# Patient Record
Sex: Female | Born: 2000
Health system: Southern US, Community
[De-identification: ages and names within clinical notes are randomized; demographics above are authoritative.]

## PROBLEM LIST (undated history)

## (undated) DIAGNOSIS — R569 Unspecified convulsions: Secondary | ICD-10-CM

## (undated) DIAGNOSIS — F32A Depression, unspecified: Secondary | ICD-10-CM

## (undated) HISTORY — PX: APPENDECTOMY: SHX54

## (undated) HISTORY — DX: Unspecified convulsions: R56.9

## (undated) HISTORY — DX: Depression, unspecified: F32.A

---
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
## 2018-01-05 ENCOUNTER — Ambulatory Visit (HOSPITAL_COMMUNITY)
Admission: RE | Admit: 2018-01-05 | Discharge: 2018-01-05 | Disposition: A | Discharge status: Home / Self Care | Payer: Medicaid Other | Source: Ambulatory Visit | Attending: Neurology | Admitting: Neurology

## 2018-01-05 ENCOUNTER — Ambulatory Visit (INDEPENDENT_AMBULATORY_CARE_PROVIDER_SITE_OTHER): Payer: Medicaid Other | Admitting: Neurology

## 2018-01-05 ENCOUNTER — Encounter (INDEPENDENT_AMBULATORY_CARE_PROVIDER_SITE_OTHER): Payer: Self-pay | Admitting: Neurology

## 2018-01-05 VITALS — BP 110/64 | HR 70 | Ht 63.78 in | Wt 155.0 lb

## 2018-01-05 DIAGNOSIS — G40B09 Juvenile myoclonic epilepsy, not intractable, without status epilepticus: Secondary | ICD-10-CM

## 2018-01-05 DIAGNOSIS — Z79899 Other long term (current) drug therapy: Secondary | ICD-10-CM | POA: Diagnosis not present

## 2018-01-05 NOTE — Progress Notes (Signed)
Sleep deprived EEG completed; results pending. 

## 2018-01-05 NOTE — Procedures (Signed)
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  This is a follow-up EEG for evaluation of epileptiform discharges on lower dose of seizure medication.  Medication: Keppra  Procedure: The tracing was carried out on a 32 channel digital Cadwell recorder reformatted into 16 channel montages with 1 devoted to EKG.  The 10 /20 international system electrode placement was used. Recording was done during awake, drowsiness and sleep states. Recording time 42.5 minutes.   Description of findings: Background rhythm consists of amplitude of 50 microvolt and frequency of 9-10 hertz posterior dominant rhythm. There was normal anterior posterior gradient noted. Background was well organized, continuous and symmetric with no focal slowing. There was muscle artifact noted. During drowsiness and sleep there was gradual decrease in background frequency noted. During the early stages of sleep there were symmetrical sleep spindles and vertex sharp waves noted.  Hyperventilation resulted in slowing of the background activity. Photic simulation using stepwise increase in photic frequency resulted in bilateral symmetric driving response.  There was no photoparoxysmal response. Throughout the recording there were no focal or generalized epileptiform activities in the form of spikes or sharps noted. There were no transient rhythmic activities or electrographic seizures noted. One lead EKG rhythm strip revealed sinus rhythm at a rate of 70 bpm.  Impression: This EEG is normal during awake and sleep states. Please note that normal EEG does not exclude epilepsy, clinical correlation is indicated.     Keturah Shaverseza Jackalynn Art, MD

## 2018-01-05 NOTE — Patient Instructions (Signed)
Start taking 1000 mg Keppra every night for 1 month Then take 500 mg Keppra every night for 1 month Then discontinue medication

## 2018-01-05 NOTE — Progress Notes (Signed)
Patient: Christina Morales MRN: [id number] Sex: female DOB: 28-May-2001  Provider: Keturah Shavers, MD Location of Care: [facility] Child Neurology  Note type: Routine return visit  Referral Source: Christina Loveless, MD History from: patient and [facility] chart Chief Complaint: EEG Results  History of Present Illness: Christina Morales is a 17 y.o. female is here for follow-up management of seizure disorder and EEG result.  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 Currently she is on 1250 mg Keppra every night for the past few weeks and she is going to start 1000 mg Keppra soon. She underwent an EEG with sleep deprivation today which did not show any epileptiform discharges or seizure activity and no photoparoxysmal response during photic stimulation. As per patient she has had no clinical seizure activity or jerking episodes, sleeping well without any difficulty and has had no other issues.  Review of Systems: 12 system review as per HPI, otherwise negative.  Past Medical History:  Diagnosis Date  . Seizures [facility])     Surgical History History reviewed. No pertinent surgical history.  Family History family history is not on file.   Social History Social History   Socioeconomic History  . Marital status: Single    Spouse name: Not on file  . Number of children: Not on file  . Years of education: Not on file  . Highest education level: Not on file  Occupational History  . Not on file  Social Needs  . Financial resource strain: Not on file  . Food insecurity:    Worry: Not on file    Inability: Not on file  . Transportation needs:     Medical: Not on file    Non-medical: Not on file  Tobacco Use  . Smoking status: Never Smoker  . Smokeless tobacco: Never Used  Substance and Sexual Activity  . Alcohol use: No  . Drug use: No  . Sexual activity: Never    Birth control/protection: Abstinence  Lifestyle  . Physical activity:    Days per week: Not on file    Minutes per session: Not on file  . Stress: Not on file  Relationships  . Social connections:    Talks on phone: Not on file    Gets together: Not on file    Attends religious service: Not on file    Active member of club or organization: Not on file    Attends meetings of clubs or organizations: Not on file    Relationship status: Not on file  Other Topics Concern  . 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    She enjoys theatre and video games.   She is only working at the moment.     The medication list was reviewed and reconciled. All changes or newly prescribed medications were explained.  A complete medication list was provided to the patient/caregiver.  No Known Allergies  Physical Exam BP 110/64   Pulse 70   Ht 5' 3.78" (1.62 m)   Wt 154 lb 15.7 oz (70.3  kg)   BMI 26.79 kg/m  Gen: Awake, alert, not in distress Skin: No rash, No neurocutaneous stigmata. HEENT: Normocephalic, no dysmorphic features, no conjunctival injection, nares patent, mucous membranes moist, oropharynx clear. Neck: Supple, no meningismus. No focal tenderness. Resp: Clear to auscultation bilaterally CV: Regular rate, normal S1/S2, no murmurs, no rubs Abd: BS present, abdomen soft, non-tender, non-distended. No hepatosplenomegaly or mass Ext: Warm and well-perfused. No deformities, no muscle wasting, ROM full.  Neurological Examination: MS: Awake, alert, interactive. Normal eye contact, answered the questions appropriately, speech was fluent,  Normal comprehension.  Attention and concentration  were normal. Cranial Nerves: Pupils were equal and reactive to light ( 5-793mm);  normal fundoscopic exam with sharp discs, visual field full with confrontation test; EOM normal, no nystagmus; no ptsosis, no double vision, intact facial sensation, face symmetric with full strength of facial muscles, hearing intact to finger rub bilaterally, palate elevation is symmetric, tongue protrusion is symmetric with full movement to both sides.  Sternocleidomastoid and trapezius are with normal strength. Tone-Normal Strength-Normal strength in all muscle groups DTRs-  Biceps Triceps Brachioradialis Patellar Ankle  R 2+ 2+ 2+ 2+ 2+  L 2+ 2+ 2+ 2+ 2+   Plantar responses flexor bilaterally, no clonus noted Sensation: Intact to light touch,  Romberg negative. Coordination: No dysmetria on FTN test. No difficulty with balance. Gait: Normal walk and run. Tandem gait was normal. Was able to perform toe walking and heel walking without difficulty.  Assessment and Plan 1. 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 Her EEG in 2018 did not show any abnormal discharges and her repeat EEG on lower dose of Keppra today did not show any abnormality. Since she has been doing well without any clinical seizure activity and with normal exam and normal EEGs over the past 2 years, recommend to continue with tapering of the Keppra and discontinue medication over the next 2 months. As discussed before, there would be 30% chance of having seizure activity with discontinuation of the medication but patient would like to try and see how she does. If there is any episodes of jerking or shaking, she will call my office to schedule for another EEG at any time otherwise she will continue  tapering and discontinue the medication as a scheduled and then continue follow-up with her PCP and no neurology appointment needed.  She understood and agreed with the plan.

## 2018-01-08 ENCOUNTER — Ambulatory Visit (INDEPENDENT_AMBULATORY_CARE_PROVIDER_SITE_OTHER): Payer: Medicaid Other | Admitting: Neurology

## 2018-04-06 ENCOUNTER — Encounter (HOSPITAL_BASED_OUTPATIENT_CLINIC_OR_DEPARTMENT_OTHER): Payer: Self-pay

## 2018-04-06 ENCOUNTER — Emergency Department (HOSPITAL_BASED_OUTPATIENT_CLINIC_OR_DEPARTMENT_OTHER)
Admission: EM | Admit: 2018-04-06 | Discharge: 2018-04-06 | Disposition: A | Discharge status: Home / Self Care | Payer: Medicaid Other | Attending: Emergency Medicine | Admitting: Emergency Medicine

## 2018-04-06 ENCOUNTER — Other Ambulatory Visit: Payer: Self-pay

## 2018-04-06 DIAGNOSIS — Z79899 Other long term (current) drug therapy: Secondary | ICD-10-CM | POA: Diagnosis not present

## 2018-04-06 DIAGNOSIS — K5900 Constipation, unspecified: Secondary | ICD-10-CM | POA: Insufficient documentation

## 2018-04-06 DIAGNOSIS — R519 Headache, unspecified: Secondary | ICD-10-CM

## 2018-04-06 DIAGNOSIS — R51 Headache: Secondary | ICD-10-CM | POA: Diagnosis not present

## 2018-04-06 DIAGNOSIS — R112 Nausea with vomiting, unspecified: Secondary | ICD-10-CM | POA: Diagnosis present

## 2018-04-06 LAB — COMPREHENSIVE METABOLIC PANEL
ALBUMIN: 4.3 g/dL (ref 3.5–5.0)
ALT: 13 U/L (ref 0–44)
ANION GAP: 11 (ref 5–15)
AST: 18 U/L (ref 15–41)
Alkaline Phosphatase: 92 U/L (ref 38–126)
BILIRUBIN TOTAL: 0.6 mg/dL (ref 0.3–1.2)
BUN: 8 mg/dL (ref 6–20)
CO2: 26 mmol/L (ref 22–32)
Calcium: 9.6 mg/dL (ref 8.9–10.3)
Chloride: 101 mmol/L (ref 98–111)
Creatinine, Ser: 0.65 mg/dL (ref 0.44–1.00)
GFR calc Af Amer: >60 mL/min (ref >60)
GFR calc non Af Amer: >60 mL/min (ref >60)
GLUCOSE: 99 mg/dL (ref 70–99)
POTASSIUM: 4.4 mmol/L (ref 3.5–5.1)
Sodium: 138 mmol/L (ref 135–145)
TOTAL PROTEIN: 8 g/dL (ref 6.5–8.1)

## 2018-04-06 LAB — URINALYSIS, ROUTINE W REFLEX MICROSCOPIC
Bilirubin Urine: NEGATIVE
Glucose, UA: NEGATIVE mg/dL
Hgb urine dipstick: NEGATIVE
Ketones, ur: NEGATIVE mg/dL
LEUKOCYTES UA: NEGATIVE
NITRITE: NEGATIVE
PROTEIN: NEGATIVE mg/dL
Specific Gravity, Urine: 1.015 (ref 1.005–1.030)
pH: 8.5 — ABNORMAL HIGH (ref 5.0–8.0)

## 2018-04-06 LAB — CBC WITH DIFFERENTIAL/PLATELET
BASOS ABS: 0 10*3/uL (ref 0.0–0.1)
BASOS PCT: 0 %
Eosinophils Absolute: 0 10*3/uL (ref 0.0–0.7)
Eosinophils Relative: 0 %
HCT: 42.3 % (ref 36.0–46.0)
HEMOGLOBIN: 14.5 g/dL (ref 12.0–15.0)
Lymphocytes Relative: 10 %
Lymphs Abs: 0.9 10*3/uL (ref 0.7–4.0)
MCH: 30.2 pg (ref 26.0–34.0)
MCHC: 34.3 g/dL (ref 30.0–36.0)
MCV: 88.1 fL (ref 78.0–100.0)
MONO ABS: 0.3 10*3/uL (ref 0.1–1.0)
Monocytes Relative: 3 %
NEUTROS ABS: 8.1 10*3/uL — AB (ref 1.7–7.7)
NEUTROS PCT: 87 %
Platelets: 298 10*3/uL (ref 150–400)
RBC: 4.8 MIL/uL (ref 3.87–5.11)
RDW: 12.6 % (ref 11.5–15.5)
WBC: 9.4 10*3/uL (ref 4.0–10.5)

## 2018-04-06 LAB — PREGNANCY, URINE: PREG TEST UR: NEGATIVE

## 2018-04-06 LAB — LIPASE, BLOOD: LIPASE: 22 U/L (ref 11–51)

## 2018-04-06 MED ORDER — PROCHLORPERAZINE EDISYLATE 10 MG/2ML IJ SOLN
5.0000 mg | Freq: Once | INTRAMUSCULAR | Status: AC
  Administered 2018-04-06: 5 mg via INTRAVENOUS
  Filled 2018-04-06: qty 2

## 2018-04-06 MED ORDER — POLYETHYLENE GLYCOL 3350 17 G PO PACK: 17.0000 g | PACK | Freq: Every day | ORAL | 0 refills | Status: DC

## 2018-04-06 MED ORDER — SODIUM CHLORIDE 0.9 % IV BOLUS
1000.0000 mL | Freq: Once | INTRAVENOUS | Status: AC
  Administered 2018-04-06: 1000 mL via INTRAVENOUS

## 2018-04-06 MED ORDER — DIPHENHYDRAMINE HCL 50 MG/ML IJ SOLN
12.5000 mg | Freq: Once | INTRAMUSCULAR | Status: AC
  Administered 2018-04-06: 12.5 mg via INTRAVENOUS
  Filled 2018-04-06: qty 1

## 2018-04-06 MED ORDER — KETOROLAC TROMETHAMINE 15 MG/ML IJ SOLN
15.0000 mg | Freq: Once | INTRAMUSCULAR | Status: AC
  Administered 2018-04-06: 15 mg via INTRAVENOUS
  Filled 2018-04-06: qty 1

## 2018-04-06 MED FILL — SM CLEARLAX POWDER: 14 days supply | Qty: 238 | Fill #0

## 2018-04-06 NOTE — Discharge Instructions (Signed)
Please follow-up with your neurologist in regards to your frequent headaches this month.  Please take the MiraLAX as directed on your discharge paperwork and follow-up with your primary care doctor in regards to your constipation although this month.  Please return to the emergency department for any new or worsening symptoms including severe headaches, vision changes, numbness/weakness to the arms or legs, persistent nausea vomiting, fevers or chills.

## 2018-04-06 NOTE — ED Provider Notes (Signed)
[facility] EMERGENCY DEPARTMENT Provider Note   CSN: [id number] Arrival date & time: 04/06/18  1346     History   Chief Complaint Chief Complaint  Patient presents with  . Emesis    HPI Christina Morales is a 17 y.o. female.  HPI   Pt is a 17 y/o female with a h/o seizures who presents to the ED today for evaluation of nausea and vomiting that began yesterday. Has had 5 episodes of vomiting. States that she also has a left sided headache with her sxs.  Denies abd pain, diarrhea, bloody stools, dysuria, hematuria. Denies vision changes, lightheadedness, dizziness, numbness, weakness.  States that she has a h/o chronic constipation over the last month and had been constipated prior to the onset of sxs. States that her last BM was 4 days ago. Not taking meds for this. States she has been able to keep her seizure medications down.   States she has had similar sxs intermittently for the last month with constipation and a headache leading to NV. Denies a h/o migraines. Reviewed records and pt was seen for similar sxs in 03/2013. She currently follows with peds neuro for her h/o seizures. Had normal CT brain 09/2013.  Past Medical History:  Diagnosis Date  . Seizures [facility])     Patient Active Problem List   Diagnosis Date Noted  . 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 Juvenile myoclonic epilepsy (HCC) 10/06/2013    History reviewed. No pertinent surgical history.   OB History   None      Home Medications    Prior to Admission medications   Medication Sig Start Date End Date Taking? Authorizing Provider  acetaminophen (TYLENOL) 325 MG tablet Take 650 mg by mouth every 6 (six) hours as needed. Reported on 08/08/2015    [provider]  ibuprofen (ADVIL,MOTRIN) 200 MG tablet Take 400 mg by mouth every 6 (six) hours as needed. Reported on 08/08/2015    [provider]  levETIRAcetam (KEPPRA XR) 500 MG 24  hr tablet Take 2 tablets (1,000 mg total) by mouth daily. At night 10/08/17   Keturah Shavers, MD  naproxen sodium (ANAPROX) 220 MG tablet Take 220 mg by mouth. Reported on 08/08/2015    [provider]  ondansetron (ZOFRAN ODT) 8 MG disintegrating tablet 8mg  ODT q8 hours prn nausea Patient not taking: Reported on 10/08/2017 02/10/16   Palumbo, April, MD  polyethylene glycol [facility]) packet Take 17 g by mouth daily. Dissolve one cap full in solution (water, gatorade, etc.) and administer once cap-full daily. You may titrate up daily by 1 cap-full until the patient is having pudding consistency of stools. After the patient is able to start passing softer stools they will need to be on 1/2 cap-full daily for 2 weeks. 04/06/18   Kinneth Fujiwara S, PA-C    Family History No family history on file.  Social History Social History   Tobacco Use  . Smoking status: Never Smoker  . Smokeless tobacco: Never Used  Substance Use Topics  . Alcohol use: No  . Drug use: No     Allergies   Patient has no known allergies.   Review of Systems Review of Systems  Constitutional: Negative for chills and fever.  HENT: Positive for congestion (chronic).   Eyes: Negative for photophobia and visual disturbance.  Respiratory: Negative for shortness of breath.   Cardiovascular: Negative for chest pain.  Gastrointestinal: Positive for constipation, nausea and vomiting.  Negative for abdominal pain, blood in stool and diarrhea.  Genitourinary: Negative for dysuria, flank pain, hematuria and urgency.  Musculoskeletal: Negative for back pain, neck pain and neck stiffness.  Skin: Negative for wound.  Neurological: Positive for headaches. Negative for dizziness, weakness, light-headedness and numbness.   Physical Exam Updated Vital Signs BP 116/87 (BP Location: Right Arm)   Pulse 74   Temp 98 F (36.7 C) (Oral)   Resp 18   Ht 5\' 3"  (1.6 m)   Wt 68.5 kg   LMP  (LMP Unknown)   SpO2 100%   BMI 26.75  kg/m   Physical Exam  Constitutional: She appears well-developed and well-nourished. No distress.  HENT:  Head: Normocephalic and atraumatic.  Right Ear: External ear normal.  Left Ear: External ear normal.  Mouth/Throat: Oropharynx is clear and moist.  bilat TMs normal.   Eyes: Pupils are equal, round, and reactive to light. Conjunctivae and EOM are normal.  No horizontal or vertical nystagmus  Neck: Normal range of motion. Neck supple.  Cardiovascular: Normal rate, regular rhythm and normal heart sounds.  No murmur heard. Pulmonary/Chest: Effort normal and breath sounds normal. No stridor. No respiratory distress. She has no wheezes.  Abdominal: Soft. Bowel sounds are normal. She exhibits no distension. There is no tenderness. There is no guarding.  Musculoskeletal: Normal range of motion.  Neurological: She is alert.  Mental Status:  Alert, thought content appropriate, able to give a coherent history. Speech fluent without evidence of aphasia. Able to follow 2 step commands without difficulty.  Cranial Nerves:  II: pupils equal, round, reactive to light III,IV, VI: ptosis not present, extra-ocular motions intact bilaterally  V,VII: smile symmetric, facial light touch sensation equal VIII: hearing grossly normal to voice  X: uvula elevates symmetrically  XI: bilateral shoulder shrug symmetric and strong XII: midline tongue extension without fassiculations Motor:  Normal tone. 5/5 strength of BUE and BLE major muscle groups including strong and equal grip strength and dorsiflexion/plantar flexion Sensory: light touch normal in all extremities. Cerebellar: normal finger-to-nose with bilateral upper extremities, normal heel to shin Gait: normal gait and balance. Able to walk on toes and heels with ease.  CV: 2+ radial and DP pulses Negative romberg  Skin: Skin is warm and dry.  Psychiatric: She has a normal mood and affect.  Nursing note and vitals reviewed.   ED Treatments /  Results  Labs (all labs ordered are listed, but only abnormal results are displayed) Labs Reviewed  URINALYSIS, ROUTINE W REFLEX MICROSCOPIC - Abnormal; Notable for the following components:      Result Value   APPearance CLOUDY (*)    pH 8.5 (*)    All other components within normal limits  CBC WITH DIFFERENTIAL/PLATELET - Abnormal; Notable for the following components:   Neutro Abs 8.1 (*)    All other components within normal limits  PREGNANCY, URINE  COMPREHENSIVE METABOLIC PANEL  LIPASE, BLOOD    EKG None  Radiology No results found.  Procedures Procedures (including critical care time)  Medications Ordered in ED Medications  sodium chloride 0.9 % bolus 1,000 mL (0 mLs Intravenous Stopped 04/06/18 1600)  prochlorperazine (COMPAZINE) injection 5 mg (5 mg Intravenous Given 04/06/18 1454)  ketorolac (TORADOL) 15 MG/ML injection 15 mg (15 mg Intravenous Given 04/06/18 1454)  diphenhydrAMINE (BENADRYL) injection 12.5 mg (12.5 mg Intravenous Given 04/06/18 1454)     Initial Impression / Assessment and Plan / ED Course  I have reviewed the triage vital signs and the  nursing notes.  Pertinent labs & imaging results that were available during my care of the patient were reviewed by me and considered in my medical decision making (see chart for details).    Final Clinical Impressions(s) / ED Diagnoses   Final diagnoses:  Acute nonintractable headache, unspecified headache type  Constipation, unspecified constipation type   Pt presenting for NV, constipation, and left sided headache x2 days. VSS, afebrile. Normal neuro and abdominal exam. Has a h/o similar sxs in the past but has not been diagnosed with migraines. She currently follows with peds neurology. At this time unclear if NV is a result of constipation or if related to headache though seems more likely related to HA. Will check labs and give migraine cocktail and reassess.  Labs reassuring. CBC with no leukocytosis or  anemia. CMP with normal electrolytes, kidney and liver function. Lipase WNL. Pregnancy test negative. UA without evidence of UTI.  After medications pt feels improved and states that she no longer has a headache. Nausea has also improved and she is now tolerating PO. Her initial abd exam was benign and down emergency intraabdominal pathology at this time.  Will give rx for miralax for her constipation and have her f/u with her neurologist in relation to her headaches this month. She is agreeable to the plan for f/u. Gave strict return precautions for any new or worsening sxs. All questions answered. Pt stable for d/c.  ED Discharge Orders         Ordered    polyethylene glycol [facility]) packet  Daily     04/06/18 [address], Yani Coventry S, PA-C 04/06/18 1612    Pricilla Loveless, MD 04/07/18 [id number]

## 2018-04-06 NOTE — ED Triage Notes (Signed)
C/o n/v x 2 weeks- HA x 2 days-NAD-steady gait

## 2018-04-09 ENCOUNTER — Telehealth: Payer: Self-pay | Admitting: Neurology

## 2018-04-09 DIAGNOSIS — G40B09 Juvenile myoclonic epilepsy, not intractable, without status epilepticus: Secondary | ICD-10-CM

## 2018-04-09 NOTE — Telephone Encounter (Signed)
°  Who's calling (name and relationship to patient) :Joyce Copa I (Self)  Best contact number: [phone] (M)  Provider they see: Devonne Doughty  Reason for call: patient states that she has been having severe headaches, wants to know if a EEG should be conducted

## 2018-04-09 NOTE — Telephone Encounter (Signed)
Called patient there was no answer and mailbox was full so I was not able to leave message. Tresa Endo, Please schedule her for a routine EEG over the next few days and tell her to take occasional Tylenol or ibuprofen for now. Send the EEG to me so I would read that and let you know about the result and also make an appointment for a couple of weeks and tell her to make a diary of the headaches so I will see if we need to start any medication for headache if the EEG is normal.  If there is any severe headache or vomiting then she needs to go to the emergency room.  I placed an order for routine EEG.

## 2018-04-09 NOTE — Telephone Encounter (Signed)
Pt states that she has been having headaches for about a month, had nausea and vomiting. Took ibuprofen and aleve with no relief. No changes to vision. Sensitive to light while having the headache. I let patient know that I would ask Dr. Devonne Doughty what he advises and give her a call back.

## 2018-04-09 NOTE — Telephone Encounter (Signed)
Attempted to call patient to get more info. Vm was full. Will send to Dr. Devonne Doughty to see what he advises

## 2018-04-12 NOTE — Telephone Encounter (Signed)
Spoke to patient and let her know what Dr. Devonne Doughty advised. Made an appt for EEG as well as a f/u. Patient is aware to keep a diary and to go to the ER if severe headache occurs.

## 2018-04-13 ENCOUNTER — Other Ambulatory Visit: Payer: Self-pay

## 2018-04-13 ENCOUNTER — Encounter (HOSPITAL_COMMUNITY): Payer: Self-pay

## 2018-04-13 ENCOUNTER — Emergency Department (HOSPITAL_COMMUNITY)
Admission: EM | Admit: 2018-04-13 | Discharge: 2018-04-14 | Disposition: A | Discharge status: Home / Self Care | Payer: Medicaid Other | Source: Home / Self Care | Attending: Emergency Medicine | Admitting: Emergency Medicine

## 2018-04-13 ENCOUNTER — Emergency Department (HOSPITAL_COMMUNITY): Payer: Medicaid Other

## 2018-04-13 ENCOUNTER — Encounter (HOSPITAL_COMMUNITY): Payer: Self-pay | Admitting: Emergency Medicine

## 2018-04-13 ENCOUNTER — Emergency Department (HOSPITAL_COMMUNITY)
Admission: EM | Admit: 2018-04-13 | Discharge: 2018-04-13 | Disposition: A | Discharge status: Home / Self Care | Payer: Medicaid Other | Attending: Emergency Medicine | Admitting: Emergency Medicine

## 2018-04-13 DIAGNOSIS — Z79899 Other long term (current) drug therapy: Secondary | ICD-10-CM

## 2018-04-13 DIAGNOSIS — R55 Syncope and collapse: Secondary | ICD-10-CM

## 2018-04-13 DIAGNOSIS — R569 Unspecified convulsions: Secondary | ICD-10-CM | POA: Diagnosis not present

## 2018-04-13 MED ORDER — LEVETIRACETAM ER 500 MG PO TB24: 1000.0000 mg | ORAL_TABLET | Freq: Every day | ORAL | 0 refills | Status: DC

## 2018-04-13 MED ORDER — ONDANSETRON 4 MG PO TBDP: ORAL_TABLET | ORAL | 0 refills | Status: DC

## 2018-04-13 MED ORDER — SODIUM CHLORIDE 0.9 % IV BOLUS
1000.0000 mL | Freq: Once | INTRAVENOUS | Status: AC
  Administered 2018-04-14: 1000 mL via INTRAVENOUS

## 2018-04-13 NOTE — ED Provider Notes (Signed)
[facility]-EMERGENCY DEPT Provider Note: Lowella Dell, MD, FACEP  CSN: [id number] MRN: [id number] ARRIVAL: 04/13/18 at 1934 ROOM: WA17/WA17   CHIEF COMPLAINT  Near Syncope   HISTORY OF PRESENT ILLNESS  04/13/18 11:19 PM Christina Morales is a 17 y.o. 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  She was seen this morning for a seizure while taking a shower.  CT of the head and laboratory studies were unremarkable.  She returns because of a near syncopal episode about 7:30 PM.  She was getting up to go to the bathroom and felt lightheaded.  Her mother helped her to the ground but she did not fully lose consciousness.  Her friend found her hyperventilating and complaining of numbness in her hands.  She remembers feeling lightheaded and "seeing stars" but does not remember the rest of the episode.  She did not have seizure-like activity at the time.  She is complaining of intermittent nausea for the last month for which she was given Zofran earlier today.  She is also complaining of a headache and feeling thirsty.   Past Medical History:  Diagnosis Date  . Seizures (HCC)     History reviewed. No pertinent surgical history.  History reviewed. No pertinent family history.  Social History   Tobacco Use  . Smoking status: Never Smoker  . Smokeless tobacco: Never Used  Substance Use Topics  . Alcohol use: No  . Drug use: No    Prior to Admission medications   Medication Sig Start Date End Date Taking? Authorizing Provider  ibuprofen (ADVIL,MOTRIN) 200 MG tablet Take 400 mg by mouth daily as needed for headache.     [provider]  levETIRAcetam (KEPPRA XR) 500 MG 24 hr tablet Take 2 tablets (1,000 mg total) by mouth daily. At night 04/13/18 05/13/18  Mortis, Jerrel Ivory I, PA-C  ondansetron (ZOFRAN-ODT) 4 MG disintegrating tablet 8mg  ODT q8 hours prn nausea 04/13/18   Mortis, Jerrel Ivory I, PA-C  polyethylene glycol (MIRALAX) packet Take 17 g by mouth  daily. Dissolve one cap full in solution (water, gatorade, etc.) and administer once cap-full daily. You may titrate up daily by 1 cap-full until the patient is having pudding consistency of stools. After the patient is able to start passing softer stools they will need to be on 1/2 cap-full daily for 2 weeks. Patient taking differently: Take 17 g by mouth daily as needed for moderate constipation. Dissolve one cap full in solution (water, gatorade, etc.) and administer once cap-full daily. You may titrate up daily by 1 cap-full until the patient is having pudding consistency of stools. After the patient is able to start passing softer stools they will need to be on 1/2 cap-full daily for 2 weeks. 04/06/18   Couture, Cortni S, PA-C    Allergies Patient has no known allergies.   REVIEW OF SYSTEMS  Negative except as noted here or in the History of Present Illness.   PHYSICAL EXAMINATION  Initial Vital Signs Blood pressure 128/78, pulse 81, temperature 98.4 F (36.9 C), temperature source Oral, resp. rate 16, height 5' 3.5" (1.613 m), weight 68.5 kg, SpO2 99 %.  Examination General: Well-developed, well-nourished female in no acute distress; appearance consistent with age of record HENT: normocephalic; atraumatic Eyes: pupils equal, round and reactive to light; extraocular muscles intact Neck: supple Heart: regular rate and rhythm Lungs: clear to auscultation bilaterally Abdomen: soft; nondistended; nontender; bowel sounds present Extremities: No deformity; full range of motion; pulses normal Neurologic: Awake,  alert and oriented; motor function intact in all extremities and symmetric; no facial droop Skin: Warm and dry Psychiatric: Flat affect   RESULTS  Summary of this visit's results, reviewed by myself:   EKG Interpretation  Date/Time:    Ventricular Rate:    PR Interval:    QRS Duration:   QT Interval:    QTC Calculation:   R Axis:     Text Interpretation:         Laboratory Studies: Results for orders placed or performed during the hospital encounter of 04/13/18 (from the past 24 hour(s))  Urinalysis, Routine w reflex microscopic     Status: Abnormal   Collection Time: 04/13/18 11:48 PM  Result Value Ref Range   Color, Urine YELLOW YELLOW   APPearance HAZY (A) CLEAR   Specific Gravity, Urine 1.015 1.005 - 1.030   pH 7.0 5.0 - 8.0   Glucose, UA NEGATIVE NEGATIVE mg/dL   Hgb urine dipstick NEGATIVE NEGATIVE   Bilirubin Urine NEGATIVE NEGATIVE   Ketones, ur 20 (A) NEGATIVE mg/dL   Protein, ur 30 (A) NEGATIVE mg/dL   Nitrite NEGATIVE NEGATIVE   Leukocytes, UA NEGATIVE NEGATIVE   RBC / HPF 0-5 0 - 5 RBC/hpf   WBC, UA 6-10 0 - 5 WBC/hpf   Bacteria, UA RARE (A) NONE SEEN   Squamous Epithelial / LPF 0-5 0 - 5   Mucus PRESENT    Hyaline Casts, UA PRESENT   CBG monitoring, ED     Status: None   Collection Time: 04/14/18 12:18 AM  Result Value Ref Range   Glucose-Capillary 93 70 - 99 mg/dL   Imaging Studies: Ct Head Wo Contrast  Result Date: 04/13/2018 CLINICAL DATA:  Hit head on solid object.  History of seizures EXAM: CT HEAD WITHOUT CONTRAST TECHNIQUE: Contiguous axial images were obtained from the base of the skull through the vertex without intravenous contrast. COMPARISON:  September 12, 2013 FINDINGS: Brain: Ventricles are normal in size and configuration. There is no intracranial mass, hemorrhage, extra-axial fluid collection, or midline shift. Brain parenchyma appears normal. No evident acute infarct. Vascular: No hyperdense vessel. No appreciable vascular calcification evident. Skull: The bony calvarium appears intact. Sinuses/Orbits: There is mucosal thickening throughout portions of the right sphenoid sinus. There is mild mucosal thickening in several ethmoid air cells. Other visualized paranasal sinuses are clear. Visualized orbits appear symmetric bilaterally. Other: Mastoid air cells are clear. IMPRESSION: Paranasal sinus disease, most  notably in the right sphenoid region. Study otherwise unremarkable. Electronically Signed   By: Bretta Bang III M.D.   On: 04/13/2018 11:57    ED COURSE and MDM  Nursing notes and initial vitals signs, including pulse oximetry, reviewed.  Vitals:   04/13/18 2330 04/13/18 2345 04/14/18 0000 04/14/18 0020  BP: 128/81 121/68 132/78 132/78  Pulse: 90 92 85 76  Resp:    17  Temp:      TempSrc:      SpO2: 98% 100% 99% 100%  Weight:      Height:       12:57 AM Patient feeling better after IV fluid bolus and Zofran.  The patient's event does not sound consistent with a seizure.  She was advised to follow-up with her neurologist.  PROCEDURES    ED DIAGNOSES     ICD-10-CM   1. Near syncope R55        Shealyn Sean, Jonny Ruiz, MD 04/14/18 [phone]

## 2018-04-13 NOTE — ED Notes (Signed)
Bed: ZO10 Expected date:  Expected time:  Means of arrival:  Comments: Ems sz

## 2018-04-13 NOTE — ED Provider Notes (Signed)
[facility]-EMERGENCY DEPT Provider Note  CSN: [id number] Arrival date & time: 04/13/18  1047    History   Chief Complaint Chief Complaint  Patient presents with  . Seizures    HPI Christina Morales is a 17 y.o. female with a medical history of seizure disorder who presented to the ED following a possible seizure. Patient's significant other heard the patient collapse while she was in the shower and found her lying in the shower. He did not report any tonic or myoclonic movements. Patient remembers waking up around 9:15am getting in the shower and then she states it was about 9:45am when she began remembering/came out of postictal state.  The history is provided by the patient and a significant other.  Seizures   The current episode started 1 to 2 hours ago. Number of times: Unknown. The most recent episode lasted less than 30 seconds. Associated symptoms include confusion and headaches. Pertinent negatives include no speech difficulty, no visual disturbance, no neck stiffness and no muscle weakness. Unwitnessed The episode was not witnessed. There was no sensation of an aura present. The seizures did not continue in the ED. The seizure(s) had no focality. Possible causes include sleep deprivation. Has been without seizure and antiepileptic for 5 years There has been no fever. There were no medications administered prior to arrival.   Past Medical History:  Diagnosis Date  . Seizures [facility])     Patient Active Problem List   Diagnosis Date Noted  . 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 Juvenile myoclonic epilepsy (HCC) 10/06/2013    History reviewed. No pertinent surgical history.   OB History   None      Home Medications    Prior to Admission medications   Medication Sig Start Date End Date Taking? Authorizing Provider  ibuprofen (ADVIL,MOTRIN) 200 MG tablet Take 400 mg by mouth daily as needed for  headache.    Yes [provider]  polyethylene glycol (MIRALAX) packet Take 17 g by mouth daily. Dissolve one cap full in solution (water, gatorade, etc.) and administer once cap-full daily. You may titrate up daily by 1 cap-full until the patient is having pudding consistency of stools. After the patient is able to start passing softer stools they will need to be on 1/2 cap-full daily for 2 weeks. Patient taking differently: Take 17 g by mouth daily as needed for moderate constipation. Dissolve one cap full in solution (water, gatorade, etc.) and administer once cap-full daily. You may titrate up daily by 1 cap-full until the patient is having pudding consistency of stools. After the patient is able to start passing softer stools they will need to be on 1/2 cap-full daily for 2 weeks. 04/06/18  Yes Couture, Cortni S, PA-C  levETIRAcetam (KEPPRA XR) 500 MG 24 hr tablet Take 2 tablets (1,000 mg total) by mouth daily. At night 04/13/18 05/13/18  Mortis, Jerrel Ivory I, PA-C  ondansetron (ZOFRAN-ODT) 4 MG disintegrating tablet 8mg  ODT q8 hours prn nausea 04/13/18   Mortis, Sharyon Medicus, PA-C    Family History History reviewed. No pertinent family history.  Social History Social History   Tobacco Use  . Smoking status: Never Smoker  . Smokeless tobacco: Never Used  Substance Use Topics  . Alcohol use: No  . Drug use: No     Allergies   Patient has no known allergies.   Review of Systems Review of Systems  Constitutional: Negative.   Eyes: Negative for visual  disturbance.  Musculoskeletal: Negative.   Skin: Negative.   Neurological: Positive for seizures and headaches. Negative for dizziness, speech difficulty, weakness, light-headedness and numbness.  Psychiatric/Behavioral: Positive for confusion.  All other systems reviewed and are negative.  Physical Exam Updated Vital Signs BP 126/86   Pulse 91   Temp 97.9 F (36.6 C) (Oral)   Resp 19   LMP  (LMP Unknown)   SpO2 100%    Physical Exam  Constitutional: She is oriented to person, place, and time. She appears well-developed and well-nourished. No distress.  HENT:  Head: Normocephalic and atraumatic.  Mouth/Throat: Uvula is midline, oropharynx is clear and moist and mucous membranes are normal.  Eyes: Pupils are equal, round, and reactive to light. Conjunctivae, EOM and lids are normal.  Neck: Normal range of motion and full passive range of motion without pain. Neck supple. No spinous process tenderness and no muscular tenderness present. Normal range of motion present.  Cardiovascular: Normal rate, regular rhythm, normal heart sounds, intact distal pulses and normal pulses.  No murmur heard. Pulmonary/Chest: Effort normal and breath sounds normal.  Musculoskeletal: Normal range of motion.  Neurological: She is alert and oriented to person, place, and time. She has normal strength and normal reflexes. No cranial nerve deficit or sensory deficit. She exhibits normal muscle tone. She displays no seizure activity. GCS eye subscore is 4. GCS verbal subscore is 5. GCS motor subscore is 6.  Skin: Skin is warm and intact. Capillary refill takes less than 2 seconds. No abrasion, no bruising and no laceration noted.  Psychiatric: She has a normal mood and affect. Her speech is normal and behavior is normal. Thought content normal. Cognition and memory are normal.  Nursing note and vitals reviewed.  ED Treatments / Results  Labs (all labs ordered are listed, but only abnormal results are displayed) Labs Reviewed - No data to display  EKG None  Radiology Ct Head Wo Contrast  Result Date: 04/13/2018 CLINICAL DATA:  Hit head on solid object.  History of seizures EXAM: CT HEAD WITHOUT CONTRAST TECHNIQUE: Contiguous axial images were obtained from the base of the skull through the vertex without intravenous contrast. COMPARISON:  September 12, 2013 FINDINGS: Brain: Ventricles are normal in size and configuration. There is no  intracranial mass, hemorrhage, extra-axial fluid collection, or midline shift. Brain parenchyma appears normal. No evident acute infarct. Vascular: No hyperdense vessel. No appreciable vascular calcification evident. Skull: The bony calvarium appears intact. Sinuses/Orbits: There is mucosal thickening throughout portions of the right sphenoid sinus. There is mild mucosal thickening in several ethmoid air cells. Other visualized paranasal sinuses are clear. Visualized orbits appear symmetric bilaterally. Other: Mastoid air cells are clear. IMPRESSION: Paranasal sinus disease, most notably in the right sphenoid region. Study otherwise unremarkable. Electronically Signed   By: Bretta Bang III M.D.   On: 04/13/2018 11:57    Procedures Procedures (including critical care time)  Medications Ordered in ED Medications - No data to display   Initial Impression / Assessment and Plan / ED Course  Triage vital signs and the nursing notes have been reviewed.  Pertinent labs & imaging results that were available during care of the patient were reviewed and considered in medical decision making (see chart for details).  Patient presents following a reported seizure. She has been seizure-free and without an antiepileptic for 5 years. Denies recent head trauma or injury or any acute trigger to this seizure. Patient's physical exam is unremarkable and there are no focal neuro deficits.  Will obtain head CT to evaluate for new underlying cause to seizure such as stroke, hemorrhage or mass. Patient reports having an EEG appointment this week and a neurology follow-up within 2 weeks.  Clinical Course as of Apr 13 1353  Tue Apr 13, 2018  1236 CT head shows no signs of stroke or hemorrhage. No new structural changes such as atrophy or masses that could be contributing to new seizure.   [GM]    Clinical Course User Index [GM] Mortis, Sharyon Medicus, PA-C    Final Clinical Impressions(s) / ED Diagnoses   1. Seizure. Restart previous antiepileptic Keppra 1000mg  BID. Patient has scheduled EEG for 04/15/18 and follow-up with neurologist on 04/30/18. Education provided on s/s that warrant sooner medical follow-up.  Dispo: Home. After thorough clinical evaluation, this patient is determined to be medically stable and can be safely discharged with the previously mentioned treatment and/or outpatient follow-up/referral(s). At this time, there are no other apparent medical conditions that require further screening, evaluation or treatment.   Final diagnoses:  Seizure [facility])    ED Discharge Orders         Ordered    levETIRAcetam (KEPPRA XR) 500 MG 24 hr tablet  Daily     04/13/18 1253    ondansetron (ZOFRAN-ODT) 4 MG disintegrating tablet     04/13/18 1343            Mortis, [place] I, PA-C 04/13/18 1354    Raeford Razor, MD 04/14/18 1313

## 2018-04-13 NOTE — ED Notes (Signed)
Pt states that she was eating and became nauseated, diaphoretic, and cool and clammy prior to seeing stars and felt as thought she was going to pass out. Pt reports having a 4/10 HA pain.

## 2018-04-13 NOTE — ED Triage Notes (Signed)
Pt arrived via EMS from home. Pt reports that she stood up from the table and felt as if she was going to pass out. Pt reports that she can not remember this event per  pt mother reports that pt was engaging in conversing during episode. Pt was seen here this am for SZ.   EMS v/s 118/76, HR 78, RR 16, 98% RA CBG 93   20 G left AC

## 2018-04-13 NOTE — ED Triage Notes (Signed)
Per [facility], pt here with seizure this morning in the shower. Family heard a thud and found her in the shower in postichtal state. Hx of seizures and on Keppra, but PCP has started to wean her dose down due to not having seizures since Nov 2015. Patient c/o head hurting and is in C-collar due to fall. Patient is current;y AOx4 and does not remember seizure. She remembers everything that happened before and after.

## 2018-04-13 NOTE — Discharge Instructions (Signed)
Your head CT was normal today and did not show any signs of stroke, bleeding or masses that would have caused today's seizure.  I have refilled your Keppra so you can take that until you are seen by your neurologist.  Thank you for allowing me to take care of you today.

## 2018-04-14 LAB — URINALYSIS, ROUTINE W REFLEX MICROSCOPIC
Bilirubin Urine: NEGATIVE
GLUCOSE, UA: NEGATIVE mg/dL
HGB URINE DIPSTICK: NEGATIVE
Ketones, ur: 20 mg/dL — AB
LEUKOCYTES UA: NEGATIVE
NITRITE: NEGATIVE
PH: 7 (ref 5.0–8.0)
Protein, ur: 30 mg/dL — AB
Specific Gravity, Urine: 1.015 (ref 1.005–1.030)

## 2018-04-14 LAB — CBG MONITORING, ED: GLUCOSE-CAPILLARY: 93 mg/dL (ref 70–99)

## 2018-04-15 ENCOUNTER — Telehealth (INDEPENDENT_AMBULATORY_CARE_PROVIDER_SITE_OTHER): Payer: Self-pay | Admitting: Neurology

## 2018-04-15 ENCOUNTER — Ambulatory Visit (INDEPENDENT_AMBULATORY_CARE_PROVIDER_SITE_OTHER): Payer: Medicaid Other | Admitting: Pediatrics

## 2018-04-15 DIAGNOSIS — G40B09 Juvenile myoclonic epilepsy, not intractable, without status epilepticus: Secondary | ICD-10-CM | POA: Diagnosis not present

## 2018-04-15 MED ORDER — LEVETIRACETAM ER 500 MG PO TB24: 1000.0000 mg | ORAL_TABLET | Freq: Every day | ORAL | 0 refills | Status: DC

## 2018-04-15 NOTE — Telephone Encounter (Signed)
Please call patient and let her know that her EEG is a slightly abnormal and it would be better to start Keppra again.  I sent a prescription for Keppra with the same dose that she was taking before which would be 2 tablets every night. I will see her in a few weeks and will see how she does and discuss if any other treatment needed.

## 2018-04-15 NOTE — Procedures (Signed)
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  She has been off of the EEG report a couple months and had an episode of syncopal events concerning for seizure activity.  EEG was done to evaluate for possible epileptiform discharges.   Medication: None  Procedure: The tracing was carried out on a 32 channel digital [employer]well recorder reformatted into 16 channel montages with 1 devoted to EKG.  The 10 /20 international system electrode placement was used. Recording was done during awake state. Recording time 42.5 minutes.   Description of findings: Background rhythm consists of amplitude of 40 microvolt and frequency of 9-10 hertz posterior dominant rhythm. There was normal anterior posterior gradient noted. Background was well organized, continuous and symmetric with no focal slowing. There was muscle artifact noted. Hyperventilation resulted in slowing of the background activity. Photic stimulation using stepwise increase in photic frequency resulted in bilateral symmetric driving response.  There was no photoparoxysmal response. Throughout the recording there were sporadic spikes and sharps noted in abnormal due to frontocentral area, right more than left and mostly during hyperventilation. There were no transient rhythmic activities or electrographic seizures noted. One lead EKG rhythm strip revealed sinus rhythm at a rate of 60 bpm.  Impression: This EEG is abnormal due to sporadic spikes and sharps in the frontocentral area, mostly in the right side and mostly during hyperventilation.  The findings are suggestive of cortical irritation and increased epileptic potential and require careful clinical correlation.    Keturah Shavers, MD

## 2018-04-19 NOTE — Telephone Encounter (Signed)
Attempted to call patient, vm was full will try again later

## 2018-04-20 NOTE — Telephone Encounter (Signed)
Phone goes straight to vm, I will have Faby give mom a call who speaks spanish to try and get this info to the patient

## 2018-04-21 ENCOUNTER — Encounter (HOSPITAL_COMMUNITY): Payer: Self-pay | Admitting: *Deleted

## 2018-04-21 ENCOUNTER — Emergency Department (HOSPITAL_COMMUNITY): Payer: Medicaid Other

## 2018-04-21 ENCOUNTER — Observation Stay (HOSPITAL_COMMUNITY): Payer: Medicaid Other | Admitting: Anesthesiology

## 2018-04-21 ENCOUNTER — Observation Stay (HOSPITAL_COMMUNITY)
Admission: EM | Admit: 2018-04-21 | Discharge: 2018-04-22 | Disposition: A | Discharge status: Home / Self Care | Payer: Medicaid Other | Attending: Surgery | Admitting: Surgery

## 2018-04-21 ENCOUNTER — Encounter (HOSPITAL_COMMUNITY)
Admission: EM | Disposition: A | Discharge status: Home / Self Care | Payer: Self-pay | Source: Home / Self Care | Attending: Emergency Medicine

## 2018-04-21 ENCOUNTER — Other Ambulatory Visit: Payer: Self-pay

## 2018-04-21 DIAGNOSIS — G40B09 Juvenile myoclonic epilepsy, not intractable, without status epilepticus: Secondary | ICD-10-CM | POA: Insufficient documentation

## 2018-04-21 DIAGNOSIS — K3580 Unspecified acute appendicitis: Secondary | ICD-10-CM | POA: Diagnosis present

## 2018-04-21 DIAGNOSIS — Z79899 Other long term (current) drug therapy: Secondary | ICD-10-CM | POA: Diagnosis not present

## 2018-04-21 DIAGNOSIS — K3589 Other acute appendicitis without perforation or gangrene: Secondary | ICD-10-CM

## 2018-04-21 HISTORY — PX: LAPAROSCOPIC APPENDECTOMY: SHX408

## 2018-04-21 LAB — COMPREHENSIVE METABOLIC PANEL
ALK PHOS: 80 U/L (ref 38–126)
ALT: 18 U/L (ref 0–44)
AST: 19 U/L (ref 15–41)
Albumin: 4.3 g/dL (ref 3.5–5.0)
Anion gap: 9 (ref 5–15)
BILIRUBIN TOTAL: 0.7 mg/dL (ref 0.3–1.2)
BUN: 8 mg/dL (ref 6–20)
CHLORIDE: 107 mmol/L (ref 98–111)
CO2: 22 mmol/L (ref 22–32)
Calcium: 9.8 mg/dL (ref 8.9–10.3)
Creatinine, Ser: 0.71 mg/dL (ref 0.44–1.00)
GFR calc Af Amer: >60 mL/min (ref >60)
Glucose, Bld: 89 mg/dL (ref 70–99)
Potassium: 4.3 mmol/L (ref 3.5–5.1)
Sodium: 138 mmol/L (ref 135–145)
TOTAL PROTEIN: 8.1 g/dL (ref 6.5–8.1)

## 2018-04-21 LAB — CBC
HEMATOCRIT: 45.6 % (ref 36.0–46.0)
Hemoglobin: 14.5 g/dL (ref 12.0–15.0)
MCH: 29.2 pg (ref 26.0–34.0)
MCHC: 31.8 g/dL (ref 30.0–36.0)
MCV: 91.9 fL (ref 80.0–100.0)
Platelets: 340 10*3/uL (ref 150–400)
RBC: 4.96 MIL/uL (ref 3.87–5.11)
RDW: 12 % (ref 11.5–15.5)
WBC: 6.4 10*3/uL (ref 4.0–10.5)
nRBC: 0 % (ref 0.0–0.2)

## 2018-04-21 LAB — URINALYSIS, ROUTINE W REFLEX MICROSCOPIC
BILIRUBIN URINE: NEGATIVE
Glucose, UA: NEGATIVE mg/dL
Hgb urine dipstick: NEGATIVE
Ketones, ur: NEGATIVE mg/dL
NITRITE: NEGATIVE
Protein, ur: NEGATIVE mg/dL
Specific Gravity, Urine: 1.014 (ref 1.005–1.030)
pH: 8 (ref 5.0–8.0)

## 2018-04-21 LAB — LIPASE, BLOOD: Lipase: 28 U/L (ref 11–51)

## 2018-04-21 LAB — WET PREP, GENITAL
CLUE CELLS WET PREP: NONE SEEN
Sperm: NONE SEEN
Trich, Wet Prep: NONE SEEN
YEAST WET PREP: NONE SEEN

## 2018-04-21 LAB — I-STAT BETA HCG BLOOD, ED (MC, WL, AP ONLY): I-stat hCG, quantitative: <5 m[IU]/mL (ref <5)

## 2018-04-21 SURGERY — APPENDECTOMY, LAPAROSCOPIC
Anesthesia: General | Site: Abdomen

## 2018-04-21 MED ORDER — FENTANYL CITRATE (PF) 100 MCG/2ML IJ SOLN
25.0000 ug | INTRAMUSCULAR | Status: DC | PRN
  Administered 2018-04-21: 50 ug via INTRAVENOUS

## 2018-04-21 MED ORDER — PROPOFOL 10 MG/ML IV BOLUS
INTRAVENOUS | Status: DC | PRN
  Administered 2018-04-21: 150 mg via INTRAVENOUS

## 2018-04-21 MED ORDER — ONDANSETRON HCL 4 MG/2ML IJ SOLN
INTRAMUSCULAR | Status: AC
  Filled 2018-04-21: qty 4

## 2018-04-21 MED ORDER — IOHEXOL 300 MG/ML  SOLN
100.0000 mL | Freq: Once | INTRAMUSCULAR | Status: AC | PRN
  Administered 2018-04-21: 100 mL via INTRAVENOUS

## 2018-04-21 MED ORDER — METRONIDAZOLE IN NACL 5-0.79 MG/ML-% IV SOLN
500.0000 mg | Freq: Once | INTRAVENOUS | Status: AC
  Administered 2018-04-21: 500 mg via INTRAVENOUS
  Filled 2018-04-21: qty 100

## 2018-04-21 MED ORDER — LEVETIRACETAM ER 500 MG PO TB24
1000.0000 mg | ORAL_TABLET | Freq: Every day | ORAL | Status: DC
  Administered 2018-04-21: 1000 mg via ORAL
  Filled 2018-04-21 (×2): qty 2

## 2018-04-21 MED ORDER — SUGAMMADEX SODIUM 200 MG/2ML IV SOLN
INTRAVENOUS | Status: DC | PRN
  Administered 2018-04-21: 140 mg via INTRAVENOUS

## 2018-04-21 MED ORDER — PROMETHAZINE HCL 25 MG/ML IJ SOLN: 6.2500 mg | INTRAMUSCULAR | Status: DC | PRN

## 2018-04-21 MED ORDER — SODIUM CHLORIDE 0.9 % IV SOLN
2.0000 g | Freq: Once | INTRAVENOUS | Status: AC
  Administered 2018-04-21: 2 g via INTRAVENOUS
  Filled 2018-04-21: qty 20

## 2018-04-21 MED ORDER — BUPIVACAINE-EPINEPHRINE 0.25% -1:200000 IJ SOLN
INTRAMUSCULAR | Status: DC | PRN
  Administered 2018-04-21: 10 mL

## 2018-04-21 MED ORDER — METRONIDAZOLE IN NACL 5-0.79 MG/ML-% IV SOLN: 500.0000 mg | Freq: Three times a day (TID) | INTRAVENOUS | Status: DC

## 2018-04-21 MED ORDER — LEVETIRACETAM ER 500 MG PO TB24: 1000.0000 mg | ORAL_TABLET | Freq: Every day | ORAL | Status: DC

## 2018-04-21 MED ORDER — FENTANYL CITRATE (PF) 100 MCG/2ML IJ SOLN
INTRAMUSCULAR | Status: DC | PRN
  Administered 2018-04-21: 100 ug via INTRAVENOUS
  Administered 2018-04-21: 50 ug via INTRAVENOUS

## 2018-04-21 MED ORDER — ONDANSETRON 4 MG PO TBDP: 4.0000 mg | ORAL_TABLET | Freq: Four times a day (QID) | ORAL | Status: DC | PRN

## 2018-04-21 MED ORDER — SCOPOLAMINE 1 MG/3DAYS TD PT72
1.0000 | MEDICATED_PATCH | TRANSDERMAL | Status: DC
  Administered 2018-04-21: 1.5 mg via TRANSDERMAL
  Filled 2018-04-21: qty 1

## 2018-04-21 MED ORDER — KETOROLAC TROMETHAMINE 30 MG/ML IJ SOLN
30.0000 mg | Freq: Four times a day (QID) | INTRAMUSCULAR | Status: DC
  Administered 2018-04-22 (×2): 30 mg via INTRAVENOUS
  Filled 2018-04-21 (×2): qty 1

## 2018-04-21 MED ORDER — KETOROLAC TROMETHAMINE 30 MG/ML IJ SOLN
INTRAMUSCULAR | Status: DC | PRN
  Administered 2018-04-21: 30 mg via INTRAVENOUS

## 2018-04-21 MED ORDER — FENTANYL CITRATE (PF) 100 MCG/2ML IJ SOLN
INTRAMUSCULAR | Status: AC
  Administered 2018-04-21: 50 ug via INTRAVENOUS
  Filled 2018-04-21: qty 2

## 2018-04-21 MED ORDER — ACETAMINOPHEN 500 MG PO TABS
1000.0000 mg | ORAL_TABLET | Freq: Four times a day (QID) | ORAL | Status: DC
  Administered 2018-04-22 (×2): 1000 mg via ORAL
  Filled 2018-04-21 (×2): qty 2

## 2018-04-21 MED ORDER — MORPHINE SULFATE (PF) 2 MG/ML IV SOLN: 2.0000 mg | INTRAVENOUS | Status: DC | PRN

## 2018-04-21 MED ORDER — PROPOFOL 10 MG/ML IV BOLUS
INTRAVENOUS | Status: AC
  Filled 2018-04-21: qty 20

## 2018-04-21 MED ORDER — SODIUM CHLORIDE 0.9 % IR SOLN
Status: DC | PRN
  Administered 2018-04-21: 1

## 2018-04-21 MED ORDER — SUCCINYLCHOLINE CHLORIDE 20 MG/ML IJ SOLN
INTRAMUSCULAR | Status: DC | PRN
  Administered 2018-04-21: 120 mg via INTRAVENOUS

## 2018-04-21 MED ORDER — MIDAZOLAM HCL 2 MG/2ML IJ SOLN
INTRAMUSCULAR | Status: AC
  Filled 2018-04-21: qty 2

## 2018-04-21 MED ORDER — DIPHENHYDRAMINE HCL 50 MG/ML IJ SOLN: 25.0000 mg | Freq: Four times a day (QID) | INTRAMUSCULAR | Status: DC | PRN

## 2018-04-21 MED ORDER — DIPHENHYDRAMINE HCL 25 MG PO CAPS: 25.0000 mg | ORAL_CAPSULE | Freq: Four times a day (QID) | ORAL | Status: DC | PRN

## 2018-04-21 MED ORDER — LACTATED RINGERS IV SOLN
INTRAVENOUS | Status: DC
  Administered 2018-04-21: 17:00:00 via INTRAVENOUS

## 2018-04-21 MED ORDER — FENTANYL CITRATE (PF) 250 MCG/5ML IJ SOLN
INTRAMUSCULAR | Status: AC
  Filled 2018-04-21: qty 5

## 2018-04-21 MED ORDER — PHENYLEPHRINE 40 MCG/ML (10ML) SYRINGE FOR IV PUSH (FOR BLOOD PRESSURE SUPPORT)
PREFILLED_SYRINGE | INTRAVENOUS | Status: AC
  Filled 2018-04-21: qty 30

## 2018-04-21 MED ORDER — SCOPOLAMINE 1 MG/3DAYS TD PT72
MEDICATED_PATCH | TRANSDERMAL | Status: AC
  Administered 2018-04-21: 1.5 mg via TRANSDERMAL
  Filled 2018-04-21: qty 1

## 2018-04-21 MED ORDER — SODIUM CHLORIDE 0.9 % IV BOLUS
1000.0000 mL | Freq: Once | INTRAVENOUS | Status: AC
  Administered 2018-04-21: 1000 mL via INTRAVENOUS

## 2018-04-21 MED ORDER — DEXAMETHASONE SODIUM PHOSPHATE 10 MG/ML IJ SOLN
INTRAMUSCULAR | Status: AC
  Filled 2018-04-21: qty 2

## 2018-04-21 MED ORDER — ROCURONIUM BROMIDE 50 MG/5ML IV SOSY
PREFILLED_SYRINGE | INTRAVENOUS | Status: DC | PRN
  Administered 2018-04-21: 30 mg via INTRAVENOUS

## 2018-04-21 MED ORDER — SODIUM CHLORIDE 0.9 % IV SOLN
INTRAVENOUS | Status: DC
  Administered 2018-04-21: 23:00:00 via INTRAVENOUS

## 2018-04-21 MED ORDER — ONDANSETRON HCL 4 MG/2ML IJ SOLN
INTRAMUSCULAR | Status: DC | PRN
  Administered 2018-04-21: 4 mg via INTRAVENOUS

## 2018-04-21 MED ORDER — ACETAMINOPHEN 325 MG PO TABS
650.0000 mg | ORAL_TABLET | Freq: Once | ORAL | Status: AC
  Administered 2018-04-21: 650 mg via ORAL
  Filled 2018-04-21: qty 2

## 2018-04-21 MED ORDER — 0.9 % SODIUM CHLORIDE (POUR BTL) OPTIME
TOPICAL | Status: DC | PRN
  Administered 2018-04-21: 1000 mL

## 2018-04-21 MED ORDER — BUPIVACAINE-EPINEPHRINE (PF) 0.25% -1:200000 IJ SOLN
INTRAMUSCULAR | Status: AC
  Filled 2018-04-21: qty 30

## 2018-04-21 MED ORDER — SODIUM CHLORIDE 0.9 % IV SOLN: 2.0000 g | INTRAVENOUS | Status: DC

## 2018-04-21 MED ORDER — KETOROLAC TROMETHAMINE 30 MG/ML IJ SOLN
INTRAMUSCULAR | Status: AC
  Filled 2018-04-21: qty 1

## 2018-04-21 MED ORDER — ONDANSETRON HCL 4 MG/2ML IJ SOLN: 4.0000 mg | Freq: Four times a day (QID) | INTRAMUSCULAR | Status: DC | PRN

## 2018-04-21 MED ORDER — LIDOCAINE 2% (20 MG/ML) 5 ML SYRINGE
INTRAMUSCULAR | Status: DC | PRN
  Administered 2018-04-21: 70 mg via INTRAVENOUS

## 2018-04-21 MED ORDER — HYDROMORPHONE HCL 1 MG/ML IJ SOLN: 0.5000 mg | INTRAMUSCULAR | Status: DC | PRN

## 2018-04-21 MED ORDER — GLYCOPYRROLATE PF 0.2 MG/ML IJ SOSY
PREFILLED_SYRINGE | INTRAMUSCULAR | Status: AC
  Filled 2018-04-21: qty 1

## 2018-04-21 MED ORDER — MIDAZOLAM HCL 2 MG/2ML IJ SOLN
INTRAMUSCULAR | Status: DC | PRN
  Administered 2018-04-21: 2 mg via INTRAVENOUS

## 2018-04-21 MED ORDER — OXYCODONE HCL 5 MG PO TABS: 5.0000 mg | ORAL_TABLET | ORAL | Status: DC | PRN

## 2018-04-21 MED ORDER — DEXAMETHASONE SODIUM PHOSPHATE 10 MG/ML IJ SOLN
INTRAMUSCULAR | Status: DC | PRN
  Administered 2018-04-21: 10 mg via INTRAVENOUS

## 2018-04-21 SURGICAL SUPPLY — 46 items
APPLIER CLIP ROT 10 11.4 M/L (STAPLE)
BENZOIN TINCTURE PRP APPL 2/3 (GAUZE/BANDAGES/DRESSINGS) ×3 IMPLANT
BLADE CLIPPER SURG (BLADE) ×3 IMPLANT
CANISTER SUCT 3000ML PPV (MISCELLANEOUS) ×3 IMPLANT
CHLORAPREP W/TINT 26ML (MISCELLANEOUS) ×3 IMPLANT
CLIP APPLIE ROT 10 11.4 M/L (STAPLE) IMPLANT
CLOSURE WOUND 1/2 X4 (GAUZE/BANDAGES/DRESSINGS) ×1
COVER SURGICAL LIGHT HANDLE (MISCELLANEOUS) ×3 IMPLANT
COVER WAND RF STERILE (DRAPES) ×3 IMPLANT
CUTTER FLEX LINEAR 45M (STAPLE) ×3 IMPLANT
DRAPE LAPAROSCOPIC ABDOMINAL (DRAPES) ×3 IMPLANT
DRSG TEGADERM 2-3/8X2-3/4 SM (GAUZE/BANDAGES/DRESSINGS) ×3 IMPLANT
DRSG TEGADERM 4X4.75 (GAUZE/BANDAGES/DRESSINGS) ×3 IMPLANT
ELECT PENCIL ROCKER SW 15FT (MISCELLANEOUS) ×3 IMPLANT
ELECT REM PT RETURN 9FT ADLT (ELECTROSURGICAL) ×3
ELECTRODE REM PT RTRN 9FT ADLT (ELECTROSURGICAL) ×1 IMPLANT
ENDOLOOP SUT PDS II  0 18 (SUTURE)
ENDOLOOP SUT PDS II 0 18 (SUTURE) IMPLANT
FILTER SMOKE EVAC LAPAROSHD (FILTER) IMPLANT
GAUZE SPONGE 2X2 8PLY STRL LF (GAUZE/BANDAGES/DRESSINGS) ×1 IMPLANT
GLOVE BIO SURGEON STRL SZ7 (GLOVE) ×3 IMPLANT
GLOVE BIOGEL PI IND STRL 7.5 (GLOVE) ×1 IMPLANT
GLOVE BIOGEL PI INDICATOR 7.5 (GLOVE) ×2
GOWN STRL REUS W/ TWL LRG LVL3 (GOWN DISPOSABLE) ×2 IMPLANT
GOWN STRL REUS W/TWL LRG LVL3 (GOWN DISPOSABLE) ×4
KIT BASIN OR (CUSTOM PROCEDURE TRAY) ×3 IMPLANT
KIT TURNOVER KIT B (KITS) ×3 IMPLANT
NS IRRIG 1000ML POUR BTL (IV SOLUTION) ×3 IMPLANT
PAD ARMBOARD 7.5X6 YLW CONV (MISCELLANEOUS) ×6 IMPLANT
POUCH SPECIMEN RETRIEVAL 10MM (ENDOMECHANICALS) ×3 IMPLANT
RELOAD STAPLE TA45 3.5 REG BLU (ENDOMECHANICALS) ×6 IMPLANT
SCISSORS ENDO CVD 5DCS (MISCELLANEOUS) IMPLANT
SET IRRIG TUBING LAPAROSCOPIC (IRRIGATION / IRRIGATOR) IMPLANT
SHEARS HARMONIC ACE PLUS 36CM (ENDOMECHANICALS) ×3 IMPLANT
SLEEVE ENDOPATH XCEL 5M (ENDOMECHANICALS) ×3 IMPLANT
SPECIMEN JAR SMALL (MISCELLANEOUS) ×3 IMPLANT
SPONGE GAUZE 2X2 STER 10/PKG (GAUZE/BANDAGES/DRESSINGS) ×2
STRIP CLOSURE SKIN 1/2X4 (GAUZE/BANDAGES/DRESSINGS) ×2 IMPLANT
SUT MNCRL AB 4-0 PS2 18 (SUTURE) ×3 IMPLANT
TOWEL OR 17X24 6PK STRL BLUE (TOWEL DISPOSABLE) ×3 IMPLANT
TOWEL OR 17X26 10 PK STRL BLUE (TOWEL DISPOSABLE) ×3 IMPLANT
TRAY LAPAROSCOPIC MC (CUSTOM PROCEDURE TRAY) ×3 IMPLANT
TROCAR XCEL BLUNT TIP 100MML (ENDOMECHANICALS) ×3 IMPLANT
TROCAR XCEL NON-BLD 5MMX100MML (ENDOMECHANICALS) ×3 IMPLANT
TUBING INSUFFLATION (TUBING) ×3 IMPLANT
WATER STERILE IRR 1000ML POUR (IV SOLUTION) ×3 IMPLANT

## 2018-04-21 NOTE — Anesthesia Procedure Notes (Addendum)
Procedure Name: Intubation Date/Time: 04/21/2018 5:52 PM Performed by: Barrington Ellison, CRNA Pre-anesthesia Checklist: Patient identified, Emergency Drugs available, Suction available and Patient being monitored Patient Re-evaluated:Patient Re-evaluated prior to induction Oxygen Delivery Method: Circle System Utilized Preoxygenation: Pre-oxygenation with 100% oxygen Induction Type: IV induction, Rapid sequence and Cricoid Pressure applied Ventilation: Mask ventilation without difficulty Laryngoscope Size: Mac and 3 Grade View: Grade I Tube type: Oral Tube size: 7.0 mm Number of attempts: 1 Airway Equipment and Method: Stylet and Oral airway Placement Confirmation: ETT inserted through vocal cords under direct vision,  positive ETCO2 and breath sounds checked- equal and bilateral Secured at: 21 cm Tube secured with: Tape Dental Injury: Teeth and Oropharynx as per pre-operative assessment

## 2018-04-21 NOTE — H&P (Signed)
Reason for Consult:  appendicits Referring Physician: [name] is an 17 y.o. female.  HPI: 17 year old presents with nausea and vomiting that started last evening.  She developed abdominal pain and a headache this AM.  Pain is rated as 8/10, and is in the right lower quadrant.  Work-up in the ED shows she is afebrile and vital signs are stable.  Labs are all essentially normal. WBC is 6.4.  Pelvic ultrasound is negative with no evidence of torsion or other acute abnormality.  CT shows hyperenhancement and suggest acute appendicitis.  She remains extremely tender in the right lower quadrant, no further nausea or vomiting.  We are asked to see.   She has a history of seizures.  She had been weaned of her Ignacio by her Neurologist and had a seizure on 04/13/18.  She was seen in the ED here and was started back on her seizure medications.  She reports no seizures for over 5 years prior to the episode last week.  No issues with seizures since her seizure last week.         Past Medical History:  Diagnosis Date  .  Juvenile myoclonic epilepsy         History reviewed. No pertinent surgical history.   History reviewed. No pertinent family history.   Social History:  reports that she has never smoked. She has never used smokeless tobacco. She reports that she does not drink alcohol or use drugs. EtOH: None Drugs: None Tobacco: None Currently working in [employer].  She lives with her family.   Allergies: No Known Allergies          Prior to Admission medications   Medication Sig Start Date End Date Taking? Authorizing Provider  ibuprofen (ADVIL,MOTRIN) 200 MG tablet Take 400 mg by mouth daily as needed for headache.        [provider]  levETIRAcetam (KEPPRA XR) 500 MG 24 hr tablet Take 2 tablets (1,000 mg total) by mouth daily. At night 04/15/18 05/15/18   Teressa Lower, MD  ondansetron (ZOFRAN-ODT) 4 MG disintegrating tablet 54m ODT q8 hours prn nausea  04/13/18     Mortis, GAlvie HeidelbergI, PA-C  polyethylene glycol ([facility] packet Take 17 g by mouth daily. Dissolve one cap full in solution (water, gatorade, etc.) and administer once cap-full daily. You may titrate up daily by 1 cap-full until the patient is having pudding consistency of stools. After the patient is able to start passing softer stools they will need to be on 1/2 cap-full daily for 2 weeks. Patient taking differently: Take 17 g by mouth daily as needed for moderate constipation. Dissolve one cap full in solution (water, gatorade, etc.) and administer once cap-full daily. You may titrate up daily by 1 cap-full until the patient is having pudding consistency of stools. After the patient is able to start passing softer stools they will need to be on 1/2 cap-full daily for 2 weeks. 04/06/18     Couture, Cortni S, PA-C    2}   Lab Results Last 48 Hours        Results for orders placed or performed during the hospital encounter of 04/21/18 (from the past 48 hour(s))  Lipase, blood     Status: None    Collection Time: 04/21/18 11:51 AM  Result Value Ref Range    Lipase 28 11 - 51 U/L      Comment: Performed at [facility] [address] [place], [place] [address]  Comprehensive metabolic panel     Status: None    Collection Time: 04/21/18 11:51 AM  Result Value Ref Range    Sodium 138 135 - 145 mmol/L    Potassium 4.3 3.5 - 5.1 mmol/L    Chloride 107 98 - 111 mmol/L    CO2 22 22 - 32 mmol/L    Glucose, Bld 89 70 - 99 mg/dL    BUN 8 6 - 20 mg/dL    Creatinine, Ser 0.71 0.44 - 1.00 mg/dL    Calcium 9.8 8.9 - 10.3 mg/dL    Total Protein 8.1 6.5 - 8.1 g/dL    Albumin 4.3 3.5 - 5.0 g/dL    AST 19 15 - 41 U/L    ALT 18 0 - 44 U/L    Alkaline Phosphatase 80 38 - 126 U/L    Total Bilirubin 0.7 0.3 - 1.2 mg/dL    GFR calc non Af Amer >60 >60 mL/min    GFR calc Af Amer >60 >60 mL/min      Comment: (NOTE) The eGFR has been calculated using the CKD EPI equation. This calculation  has not been validated in all clinical situations. eGFR's persistently <60 mL/min signify possible Chronic Kidney Disease.      Anion gap 9 5 - 15      Comment: Performed at [facility] [address] [place], [place] [address]  CBC     Status: None    Collection Time: 04/21/18 11:51 AM  Result Value Ref Range    WBC 6.4 4.0 - 10.5 K/uL    RBC 4.96 3.87 - 5.11 MIL/uL    Hemoglobin 14.5 12.0 - 15.0 g/dL    HCT 45.6 36.0 - 46.0 %    MCV 91.9 80.0 - 100.0 fL    MCH 29.2 26.0 - 34.0 pg    MCHC 31.8 30.0 - 36.0 g/dL    RDW 12.0 11.5 - 15.5 %    Platelets 340 150 - 400 K/uL    nRBC 0.0 0.0 - 0.2 %      Comment: Performed at [facility], [place] [address] [place], [place] [address]  I-Stat beta hCG blood, ED     Status: None    Collection Time: 04/21/18 11:56 AM  Result Value Ref Range    I-stat hCG, quantitative <5.0 <5 mIU/mL    Comment 3               Comment:   GEST. AGE      CONC.  (mIU/mL)   <=1 WEEK        5 - 50     2 WEEKS       50 - 500     3 WEEKS       100 - 10,000     4 WEEKS     1,000 - 30,000        FEMALE AND NON-PREGNANT FEMALE:     LESS THAN 5 mIU/mL    Urinalysis, Routine w reflex microscopic     Status: Abnormal    Collection Time: 04/21/18 11:57 AM  Result Value Ref Range    Color, Urine YELLOW YELLOW    APPearance CLEAR CLEAR    Specific Gravity, Urine 1.014 1.005 - 1.030    pH 8.0 5.0 - 8.0    Glucose, UA NEGATIVE NEGATIVE mg/dL    Hgb urine dipstick NEGATIVE NEGATIVE    Bilirubin Urine NEGATIVE NEGATIVE  Ketones, ur NEGATIVE NEGATIVE mg/dL    Protein, ur NEGATIVE NEGATIVE mg/dL    Nitrite NEGATIVE NEGATIVE    Leukocytes, UA TRACE (A) NEGATIVE    RBC / HPF 0-5 0 - 5 RBC/hpf    WBC, UA 0-5 0 - 5 WBC/hpf    Bacteria, UA RARE (A) NONE SEEN    Squamous Epithelial / LPF 0-5 0 - 5      Comment: Performed at [facility], [place] [address], [place], [place] [address]  Wet prep, genital     Status: Abnormal    Collection Time: 04/21/18   1:15 PM  Result Value Ref Range    Yeast Wet Prep HPF POC NONE SEEN NONE SEEN    Trich, Wet Prep NONE SEEN NONE SEEN    Clue Cells Wet Prep HPF POC NONE SEEN NONE SEEN    WBC, Wet Prep HPF POC MODERATE (A) NONE SEEN    Sperm NONE SEEN        Comment: Performed at [facility], [place] [address], [place], [place] [address]         Imaging Results (Last 48 hours)  US Pelvis Complete   Result Date: 04/21/2018 CLINICAL DATA:  Initial evaluation for acute right-sided pelvic pain for 1 day. EXAM: TRANSABDOMINAL ULTRASOUND OF PELVIS DOPPLER ULTRASOUND OF OVARIES TECHNIQUE: Transabdominal ultrasound examination of the pelvis was performed including evaluation of the uterus, ovaries, adnexal regions, and pelvic cul-de-sac. Color and duplex Doppler ultrasound was utilized to evaluate blood flow to the ovaries. COMPARISON:  Prior CT from earlier same day. FINDINGS: Uterus Measurements: 5.5 x 2.5 x 4.1 cm. No fibroids or other mass visualized. Endometrium Thickness: 5.5 mm.  No focal abnormality visualized. Right ovary Measurements: 4.5 x 2.3 x 2.0 cm. Normal appearance/no adnexal mass. Left ovary Measurements: 4.5 x 1.3 x 2.5 cm. Normal appearance/no adnexal mass. Pulsed Doppler evaluation demonstrates normal low-resistance arterial and venous waveforms in both ovaries. Other: No free fluid within the pelvis. IMPRESSION: Negative pelvic ultrasound. No evidence for torsion or other acute abnormality. Electronically Signed   By: Jeannine Boga M.D.   On: 04/21/2018 14:45    Ct Abdomen Pelvis W Contrast   Result Date: 04/21/2018 CLINICAL DATA:  Abdominal pain with nausea and vomiting starting last evening. Tenderness to palpation. Pain in the right upper and lower quadrant. EXAM: CT ABDOMEN AND PELVIS WITH CONTRAST TECHNIQUE: Multidetector CT imaging of the abdomen and pelvis was performed using the standard protocol following bolus administration of intravenous contrast. CONTRAST:  171m OMNIPAQUE  IOHEXOL 300 MG/ML  SOLN COMPARISON:  None. FINDINGS: Lower chest: No acute abnormality. Hepatobiliary: Mild fatty infiltration along the falciform. Otherwise, the liver is unremarkable as is the gallbladder. Pancreas: Normal Spleen: Normal Adrenals/Urinary Tract: Normal Stomach/Bowel: Abnormal appearance of the appendix with periappendiceal inflammatory change noted, series 3/56 Appendix: Location: Right hemipelvis overlying the psoas. Diameter: 8 mm Appendicolith: None Mucosal hyper-enhancement: Yes Extraluminal gas: None Periappendiceal collection: None No other acute abnormality of the gastrointestinal system. Vascular/Lymphatic: No significant vascular findings are present. No enlarged abdominal or pelvic lymph nodes. Reproductive: Small follicles noted of both ovaries. The uterus is unremarkable. Other: Tiny periumbilical fat containing hernia. No abdominopelvic ascites. Musculoskeletal: No acute or significant osseous findings. IMPRESSION: Acute uncomplicated appendicitis as above. Electronically Signed   By: DAshley RoyaltyM.D.   On: 04/21/2018 14:11    UKoreaArt/ven Flow Abd Pelv Doppler   Result Date: 04/21/2018 CLINICAL DATA:  Initial evaluation for acute right-sided pelvic  pain for 1 day. EXAM: TRANSABDOMINAL ULTRASOUND OF PELVIS DOPPLER ULTRASOUND OF OVARIES TECHNIQUE: Transabdominal ultrasound examination of the pelvis was performed including evaluation of the uterus, ovaries, adnexal regions, and pelvic cul-de-sac. Color and duplex Doppler ultrasound was utilized to evaluate blood flow to the ovaries. COMPARISON:  Prior CT from earlier same day. FINDINGS: Uterus Measurements: 5.5 x 2.5 x 4.1 cm. No fibroids or other mass visualized. Endometrium Thickness: 5.5 mm.  No focal abnormality visualized. Right ovary Measurements: 4.5 x 2.3 x 2.0 cm. Normal appearance/no adnexal mass. Left ovary Measurements: 4.5 x 1.3 x 2.5 cm. Normal appearance/no adnexal mass. Pulsed Doppler evaluation demonstrates normal  low-resistance arterial and venous waveforms in both ovaries. Other: No free fluid within the pelvis. IMPRESSION: Negative pelvic ultrasound. No evidence for torsion or other acute abnormality. Electronically Signed   By: Jeannine Boga M.D.   On: 04/21/2018 14:45       Review of Systems  Constitutional: Negative.   HENT: Negative.   Eyes: Negative.   Respiratory: Negative.   Cardiovascular: Negative.   Gastrointestinal: Negative.   Genitourinary: Negative.   Musculoskeletal: Negative.   Skin: Negative.     Blood pressure 136/87, pulse 83, temperature (!) 97.5 F (36.4 C), temperature source Oral, resp. rate 19, SpO2 99 %. Physical Exam  Constitutional: She is oriented to person, place, and time. She appears well-developed and well-nourished. No distress.  HENT:  Head: Normocephalic.  Mouth/Throat: Oropharynx is clear and moist.  Eyes: Right eye exhibits no discharge. Left eye exhibits no discharge. No scleral icterus.  Pupils are equal  Neck: Normal range of motion. Neck supple. No JVD present. No tracheal deviation present. No thyromegaly present.  Cardiovascular: Normal rate, regular rhythm, normal heart sounds and intact distal pulses.  No murmur heard. Respiratory: Effort normal and breath sounds normal. No respiratory distress. She has no wheezes. She has no rales. She exhibits no tenderness.  GI: Soft. Bowel sounds are normal. She exhibits no distension and no mass. There is tenderness (Right lower quadrant). There is no rebound and no guarding.  Musculoskeletal: She exhibits no edema or tenderness.  Lymphadenopathy:    She has no cervical adenopathy.  Neurological: She is alert and oriented to person, place, and time. No cranial nerve deficit.  Skin: Skin is warm and dry. No rash noted. She is not diaphoretic. No erythema. No pallor.  Psychiatric: She has a normal mood and affect. Her behavior is normal. Judgment and thought content normal.       Assessment/Plan: Acute appendicitis Hx juvenile myoclonic epilepsy -seizure 04/13/2018   Plan: Admit, IV antibiotics, IV hydration, surgery later this evening.       Christina Morales 04/21/2018, 4:27 PM

## 2018-04-21 NOTE — Anesthesia Preprocedure Evaluation (Addendum)
Anesthesia Evaluation  Patient identified by MRN, date of birth, ID band  Reviewed: Allergy & Precautions, NPO status , Patient's Chart, lab work & pertinent test results  History of Anesthesia Complications Negative for: history of anesthetic complications  Airway Mallampati: II  TM Distance: >3 FB Neck ROM: Full    Dental  (+) Teeth Intact, Chipped, Dental Advisory Given,    Pulmonary neg pulmonary ROS,    breath sounds clear to auscultation       Cardiovascular negative cardio ROS   Rhythm:regular Rate:Normal     Neuro/Psych Seizures -, Well Controlled,  negative psych ROS   GI/Hepatic negative GI ROS, Neg liver ROS,   Endo/Other  negative endocrine ROS  Renal/GU negative Renal ROS  negative genitourinary   Musculoskeletal negative musculoskeletal ROS (+)   Abdominal   Peds negative pediatric ROS (+)  Hematology negative hematology ROS (+)   Anesthesia Other Findings   Reproductive/Obstetrics negative OB ROS                           Anesthesia Physical Anesthesia Plan  ASA: II and emergent  Anesthesia Plan: General   Post-op Pain Management:    Induction: Intravenous, Rapid sequence and Cricoid pressure planned  PONV Risk Score and Plan: 4 or greater and Ondansetron, Dexamethasone, Scopolamine patch - Pre-op and Treatment may vary due to age or medical condition  Airway Management Planned: Oral ETT  Additional Equipment:   Intra-op Plan:   Post-operative Plan: Extubation in OR  Informed Consent:   Plan Discussed with:   Anesthesia Plan Comments:         Anesthesia Quick Evaluation

## 2018-04-21 NOTE — ED Notes (Signed)
Surgery at bedside.

## 2018-04-21 NOTE — ED Notes (Signed)
Patient transported to CT 

## 2018-04-21 NOTE — Op Note (Signed)
Appendectomy, Lap, Procedure Note  Indications: The patient presented with a history of right-sided abdominal pain. A CT scan revealed findings consistent with acute appendicitis.  Pre-operative Diagnosis: Acute appendicitis without mention of peritonitis  Post-operative Diagnosis: Same  Surgeon: Wynona Luna   Assistants: none  Anesthesia: General endotracheal anesthesia  ASA Class: 1E  Procedure Details  The patient was seen again in the Holding Room. The risks, benefits, complications, treatment options, and expected outcomes were discussed with the patient and/or family. The possibilities of reaction to medication, perforation of viscus, bleeding, recurrent infection, finding a normal appendix, the need for additional procedures, failure to diagnose a condition, and creating a complication requiring transfusion or operation were discussed. There was concurrence with the proposed plan and informed consent was obtained. The site of surgery was properly noted. The patient was taken to Operating Room, identified as Christina Morales and the procedure verified as Appendectomy. A Time Out was held and the above information confirmed.  The patient was placed in the supine position and general anesthesia was induced.  The abdomen was prepped and draped in a sterile fashion. A one centimeter infraumbilical incision was made.  Dissection was carried down to the fascia bluntly.  The fascia was incised vertically.  We entered the peritoneal cavity bluntly.  A pursestring suture was passed around the incision with a 0 Vicryl.  The Hasson cannula was introduced into the abdomen and the tails of the suture were used to hold the Hasson in place.   The pneumoperitoneum was then established maintaining a maximum pressure of 15 mmHg.  Additional 5 mm cannulas then placed in the left lower quadrant of the abdomen and the right upper quadrant under direct visualization. A careful evaluation of the entire abdomen  was carried out. The patient was placed in Trendelenburg and left lateral decubitus position.  The scope was moved to the right upper quadrant port site. The cecum was mobilized medially.  The appendix was mildly inflamed and dilated, but there was no sign of perforation.  The appendix was carefully dissected. The appendix was skeletonized with the harmonic scalpel.   The appendix was divided at its base using an endo-GIA stapler. Minimal appendiceal stump was left in place. There was no evidence of bleeding, leakage, or complication after division of the appendix. Irrigation was also performed and irrigate suctioned from the abdomen as well.  The umbilical port site was closed with the purse string suture. There was no residual palpable fascial defect.  The trocar site skin wounds were closed with 4-0 Monocryl.  Instrument, sponge, and needle counts were correct at the conclusion of the case.   Findings: The appendix was found to be inflamed. There were not signs of necrosis.  There was not perforation. There was not abscess formation.  Estimated Blood Loss:  Minimal         Drains: none         Specimens: appendix          Complications:  None; patient tolerated the procedure well.         Disposition: PACU - hemodynamically stable.         Condition: stable  [name]. [name], MD, [facility]  General/ Trauma Surgery Beeper [phone]  04/21/2018 6:27 PM

## 2018-04-21 NOTE — Transfer of Care (Signed)
Immediate Anesthesia Transfer of Care Note  Patient: Christina Morales  Procedure(s) Performed: APPENDECTOMY LAPAROSCOPIC (N/A Abdomen)  Patient Location: PACU  Anesthesia Type:General  Level of Consciousness: drowsy and patient cooperative  Airway & Oxygen Therapy: Patient Spontanous Breathing  Post-op Assessment: Report given to RN  Post vital signs: Reviewed and stable  Last Vitals:  Vitals Value Taken Time  BP    Temp    Pulse    Resp 18 04/21/2018  6:40 PM  SpO2    Vitals shown include unvalidated device data.  Last Pain:  Vitals:   04/21/18 1657  TempSrc:   PainSc: 4          Complications: No apparent anesthesia complications

## 2018-04-21 NOTE — ED Provider Notes (Signed)
[facility]ERGENCY DEPARTMENT Provider Note   CSN: [id number] Arrival date & time: 04/21/18  1144     History   Chief Complaint Chief Complaint  Patient presents with  . Abdominal Pain    HPI Christina Morales is a 17 y.o. female.  HPI   Pt is a 17 y/o female with a h/o seizures who presents to the ED today for evaluation of NV and abd pain.  States that her nausea and vomiting began last night when she developed a migraine. This AM woke up and HA has improved but nausea has continued and now she is experiencing abd pain. Pain is all over but worse to RLQ. Pain worse with palpation. Pain rated at 8/10. Reports stabbing pain that is intermittent but has become more frequent. Reports decreased appetite. Denies diarrhea, constipation, hematemesis, hematuria, hematochezia, melena, dysuria, frequency, urgency, vaginal bleeding or vaginal discharge. Nausea medications have improved nausea somewhat.   Pt reports no recent history of unprotected intercourse. Has not been sexually active since January. Denies concern for STD.   Past Medical History:  Diagnosis Date  . Seizures [facility])     Patient Active Problem List   Diagnosis Date Noted  . Acute appendicitis 04/21/2018  . 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 Juvenile myoclonic epilepsy ([place]) 10/06/2013    History reviewed. No pertinent surgical history.   OB History   None      Home Medications    Prior to Admission medications   Medication Sig Start Date End Date Taking? Authorizing Provider  ibuprofen (ADVIL,MOTRIN) 200 MG tablet Take 400 mg by mouth daily as needed for headache.     [provider]  levETIRAcetam (KEPPRA XR) 500 MG 24 hr tablet Take 2 tablets (1,000 mg total) by mouth daily. At night 04/15/18 05/15/18  Teressa Lower, MD  ondansetron (ZOFRAN-ODT) 4 MG disintegrating tablet 63m ODT q8 hours prn nausea 04/13/18   Mortis,  GAlvie HeidelbergI, PA-C  polyethylene glycol ([facility] packet Take 17 g by mouth daily. Dissolve one cap full in solution (water, gatorade, etc.) and administer once cap-full daily. You may titrate up daily by 1 cap-full until the patient is having pudding consistency of stools. After the patient is able to start passing softer stools they will need to be on 1/2 cap-full daily for 2 weeks. Patient taking differently: Take 17 g by mouth daily as needed for moderate constipation. Dissolve one cap full in solution (water, gatorade, etc.) and administer once cap-full daily. You may titrate up daily by 1 cap-full until the patient is having pudding consistency of stools. After the patient is able to start passing softer stools they will need to be on 1/2 cap-full daily for 2 weeks. 04/06/18   Sheenah Dimitroff S, PA-C    Family History History reviewed. No pertinent family history.  Social History Social History   Tobacco Use  . Smoking status: Never Smoker  . Smokeless tobacco: Never Used  Substance Use Topics  . Alcohol use: No  . Drug use: No     Allergies   Patient has no known allergies.   Review of Systems Review of Systems  Constitutional: Negative for chills and fever.  HENT: Negative for ear pain and sore throat.   Eyes: Negative for visual disturbance.  Respiratory: Negative for cough and shortness of breath.   Cardiovascular: Negative for chest pain and palpitations.  Gastrointestinal: Positive for abdominal pain, nausea and vomiting. Negative for blood  in stool, constipation and diarrhea.  Genitourinary: Negative for dysuria, flank pain, frequency, hematuria and urgency.  Musculoskeletal: Negative for back pain.  Skin: Negative for rash.  Neurological: Negative for headaches.  All other systems reviewed and are negative.   Physical Exam Updated Vital Signs BP 136/87   Pulse 83   Temp (!) 97.5 F (36.4 C) (Oral)   Resp 19   Ht 5' 3.5" (1.613 m)   Wt 68 kg   LMP  (LMP  Unknown)   SpO2 99%   BMI 26.14 kg/m   Physical Exam  Constitutional: She appears well-developed and well-nourished. No distress.  HENT:  Head: Normocephalic and atraumatic.  Mouth/Throat: Oropharynx is clear and moist.  Eyes: Conjunctivae are normal.  Neck: Neck supple.  Cardiovascular: Normal rate, regular rhythm, normal heart sounds and intact distal pulses.  Pulmonary/Chest: Effort normal and breath sounds normal. No respiratory distress. She has no wheezes.  Abdominal: Soft.  TTP to RUQ and RLQ, more so in the RLQ. Voluntary guarding present. No rigidity. No CVA TTP.   Genitourinary:  Genitourinary Comments: Exam performed by [place] Booze,  exam chaperoned Date: 04/21/2018 Pelvic exam: normal external genitalia without evidence of trauma. VULVA: normal appearing vulva with no masses, tenderness or lesion. VAGINA: normal appearing vagina with normal color and discharge, no lesions. CERVIX: normal appearing cervix without lesions, cervical motion tenderness absent, cervical os closed with out purulent discharge; Wet prep and DNA probe for chlamydia and GC obtained.   ADNEXA: right adnexal TTP, no left adnexal TTP UTERUS: uterus is normal size, shape, consistency and nontender.   Musculoskeletal: She exhibits no edema.  Neurological: She is alert.  Skin: Skin is warm and dry.  Psychiatric: She has a normal mood and affect.  Nursing note and vitals reviewed.    ED Treatments / Results  Labs (all labs ordered are listed, but only abnormal results are displayed) Labs Reviewed  WET PREP, GENITAL - Abnormal; Notable for the following components:      Result Value   WBC, Wet Prep HPF POC MODERATE (*)    All other components within normal limits  URINALYSIS, ROUTINE W REFLEX MICROSCOPIC - Abnormal; Notable for the following components:   Leukocytes, UA TRACE (*)    Bacteria, UA RARE (*)    All other components within normal limits  LIPASE, BLOOD  COMPREHENSIVE METABOLIC  PANEL  CBC  HIV ANTIBODY (ROUTINE TESTING W REFLEX)  I-STAT BETA HCG BLOOD, ED ([facility], [facility], [facility] ONLY)  GC/CHLAMYDIA PROBE AMP ([place]) NOT AT [facility]    EKG None  Radiology US Pelvis Complete  Result Date: 04/21/2018 CLINICAL DATA:  Initial evaluation for acute right-sided pelvic pain for 1 day. EXAM: TRANSABDOMINAL ULTRASOUND OF PELVIS DOPPLER ULTRASOUND OF OVARIES TECHNIQUE: Transabdominal ultrasound examination of the pelvis was performed including evaluation of the uterus, ovaries, adnexal regions, and pelvic cul-de-sac. Color and duplex Doppler ultrasound was utilized to evaluate blood flow to the ovaries. COMPARISON:  Prior CT from earlier same day. FINDINGS: Uterus Measurements: 5.5 x 2.5 x 4.1 cm. No fibroids or other mass visualized. Endometrium Thickness: 5.5 mm.  No focal abnormality visualized. Right ovary Measurements: 4.5 x 2.3 x 2.0 cm. Normal appearance/no adnexal mass. Left ovary Measurements: 4.5 x 1.3 x 2.5 cm. Normal appearance/no adnexal mass. Pulsed Doppler evaluation demonstrates normal low-resistance arterial and venous waveforms in both ovaries. Other: No free fluid within the pelvis. IMPRESSION: Negative pelvic ultrasound. No evidence for torsion or other acute abnormality. Electronically Signed  By: Jeannine Boga M.D.   On: 04/21/2018 14:45   Ct Abdomen Pelvis W Contrast  Result Date: 04/21/2018 CLINICAL DATA:  Abdominal pain with nausea and vomiting starting last evening. Tenderness to palpation. Pain in the right upper and lower quadrant. EXAM: CT ABDOMEN AND PELVIS WITH CONTRAST TECHNIQUE: Multidetector CT imaging of the abdomen and pelvis was performed using the standard protocol following bolus administration of intravenous contrast. CONTRAST:  140m OMNIPAQUE IOHEXOL 300 MG/ML  SOLN COMPARISON:  None. FINDINGS: Lower chest: No acute abnormality. Hepatobiliary: Mild fatty infiltration along the falciform. Otherwise, the liver is unremarkable as is the  gallbladder. Pancreas: Normal Spleen: Normal Adrenals/Urinary Tract: Normal Stomach/Bowel: Abnormal appearance of the appendix with periappendiceal inflammatory change noted, series 3/56 Appendix: Location: Right hemipelvis overlying the psoas. Diameter: 8 mm Appendicolith: None Mucosal hyper-enhancement: Yes Extraluminal gas: None Periappendiceal collection: None No other acute abnormality of the gastrointestinal system. Vascular/Lymphatic: No significant vascular findings are present. No enlarged abdominal or pelvic lymph nodes. Reproductive: Small follicles noted of both ovaries. The uterus is unremarkable. Other: Tiny periumbilical fat containing hernia. No abdominopelvic ascites. Musculoskeletal: No acute or significant osseous findings. IMPRESSION: Acute uncomplicated appendicitis as above. Electronically Signed   By: DAshley RoyaltyM.D.   On: 04/21/2018 14:11   UKoreaArt/ven Flow Abd Pelv Doppler  Result Date: 04/21/2018 CLINICAL DATA:  Initial evaluation for acute right-sided pelvic pain for 1 day. EXAM: TRANSABDOMINAL ULTRASOUND OF PELVIS DOPPLER ULTRASOUND OF OVARIES TECHNIQUE: Transabdominal ultrasound examination of the pelvis was performed including evaluation of the uterus, ovaries, adnexal regions, and pelvic cul-de-sac. Color and duplex Doppler ultrasound was utilized to evaluate blood flow to the ovaries. COMPARISON:  Prior CT from earlier same day. FINDINGS: Uterus Measurements: 5.5 x 2.5 x 4.1 cm. No fibroids or other mass visualized. Endometrium Thickness: 5.5 mm.  No focal abnormality visualized. Right ovary Measurements: 4.5 x 2.3 x 2.0 cm. Normal appearance/no adnexal mass. Left ovary Measurements: 4.5 x 1.3 x 2.5 cm. Normal appearance/no adnexal mass. Pulsed Doppler evaluation demonstrates normal low-resistance arterial and venous waveforms in both ovaries. Other: No free fluid within the pelvis. IMPRESSION: Negative pelvic ultrasound. No evidence for torsion or other acute abnormality.  Electronically Signed   By: BJeannine BogaM.D.   On: 04/21/2018 14:45    Procedures Procedures (including critical care time)  Medications Ordered in ED Medications  HYDROmorphone (DILAUDID) injection 0.5 mg ( Intravenous MAR Hold 04/21/18 1710)  diphenhydrAMINE (BENADRYL) capsule 25 mg ( Oral MAR Hold 04/21/18 1710)    Or  diphenhydrAMINE (BENADRYL) injection 25 mg ( Intravenous MAR Hold 04/21/18 1710)  ondansetron (ZOFRAN-ODT) disintegrating tablet 4 mg ( Oral MAR Hold 04/21/18 1710)    Or  ondansetron (ZOFRAN) injection 4 mg ( Intravenous MAR Hold 04/21/18 1710)  levETIRAcetam (KEPPRA XR) 24 hr tablet 1,000 mg ( Oral Automatically Held 04/30/18 1000)  scopolamine (TRANSDERM-SCOP) 1 MG/3DAYS 1.5 mg (1.5 mg Transdermal Patch Applied 04/21/18 1721)  lactated ringers infusion ( Intravenous New Bag/Given 04/21/18 1721)  acetaminophen (TYLENOL) tablet 650 mg (650 mg Oral Given 04/21/18 1332)  sodium chloride 0.9 % bolus 1,000 mL (0 mLs Intravenous Stopped 04/21/18 1550)  iohexol (OMNIPAQUE) 300 MG/ML solution 100 mL (100 mLs Intravenous Contrast Given 04/21/18 1345)  cefTRIAXone (ROCEPHIN) 2 g in sodium chloride 0.9 % 100 mL IVPB (0 g Intravenous Stopped 04/21/18 1636)    And  metroNIDAZOLE (FLAGYL) IVPB 500 mg (500 mg Intravenous New Bag/Given 04/21/18 1600)     Initial Impression / Assessment and Plan /  ED Course  I have reviewed the triage vital signs and the nursing notes.  Pertinent labs & imaging results that were available during my care of the patient were reviewed by me and considered in my medical decision making (see chart for details).   Final Clinical Impressions(s) / ED Diagnoses   Final diagnoses:  Other acute appendicitis   Pt with RUQ and RLQ abd pain associated with NV.  Vital signs stable.  No fevers.  CBC without leukocytosis or anemia.  CMP with normal electrolytes, kidney and liver function.  Normal T bili and alk phos.  Lipase normal.  UA without  strong evidence of urinary tract infection, patient asymptomatic.  Pelvic exam with right adnexal TTP. No CMT. No masses palpated.  Wet prep with WBC, no trichomonas, yeast, or evidence of BV.  GC/chlamydia sent. Pelvic US negative.  CT abdomen pelvis with acute uncomplicated appendicitis.   Discussed case with Dr. Regenia Skeeter who agrees with the plan to consult general surgery.  General surgery provider in the ED to evaluate patient.  General surgery accepts the patient for admission.  ED Discharge Orders    None       [place] Booze, [place] 04/21/18 1739    Sherwood Gambler, MD 04/21/18 2209

## 2018-04-21 NOTE — Consult Note (Deleted)
Reason for Consult:  appendicits Referring Physician: [name] is an 17 y.o. female.  HPI: 17 year old presents with nausea and vomiting that started last evening.  She developed abdominal pain and a headache this AM.  Pain is rated as 8/10, and is in the right lower quadrant.  Work-up in the ED shows she is afebrile and vital signs are stable.  Labs are all essentially normal. WBC is 6.4.  Pelvic ultrasound is negative with no evidence of torsion or other acute abnormality.  CT shows hyperenhancement and suggest acute appendicitis.  She remains extremely tender in the right lower quadrant, no further nausea or vomiting.  We are asked to see.  She has a history of seizures.  She had been weaned of her [place] by her Neurologist and had a seizure on 04/13/18.  She was seen in the ED here and was started back on her seizure medications.  She reports no seizures for over 5 years prior to the episode last week.  No issues with seizures since her seizure last week.   Past Medical History:  Diagnosis Date  .  Juvenile myoclonic epilepsy      History reviewed. No pertinent surgical history.  History reviewed. No pertinent family history.  Social History:  reports that she has never smoked. She has never used smokeless tobacco. She reports that she does not drink alcohol or use drugs. EtOH: None Drugs: None Tobacco: None Currently working in [employer].  She lives with her family.  Allergies: No Known Allergies  Prior to Admission medications   Medication Sig Start Date End Date Taking? Authorizing Provider  ibuprofen (ADVIL,MOTRIN) 200 MG tablet Take 400 mg by mouth daily as needed for headache.     [provider]  levETIRAcetam (KEPPRA XR) 500 MG 24 hr tablet Take 2 tablets (1,000 mg total) by mouth daily. At night 04/15/18 05/15/18  Teressa Lower, MD  ondansetron (ZOFRAN-ODT) 4 MG disintegrating tablet 79m ODT q8 hours prn nausea 04/13/18   Mortis, GAlvie HeidelbergI,  PA-C  polyethylene glycol ([facility] packet Take 17 g by mouth daily. Dissolve one cap full in solution (water, gatorade, etc.) and administer once cap-full daily. You may titrate up daily by 1 cap-full until the patient is having pudding consistency of stools. After the patient is able to start passing softer stools they will need to be on 1/2 cap-full daily for 2 weeks. Patient taking differently: Take 17 g by mouth daily as needed for moderate constipation. Dissolve one cap full in solution (water, gatorade, etc.) and administer once cap-full daily. You may titrate up daily by 1 cap-full until the patient is having pudding consistency of stools. After the patient is able to start passing softer stools they will need to be on 1/2 cap-full daily for 2 weeks. 04/06/18   Couture, Cortni S, PA-C   2}  Results for orders placed or performed during the hospital encounter of 04/21/18 (from the past 48 hour(s))  Lipase, blood     Status: None   Collection Time: 04/21/18 11:51 AM  Result Value Ref Range   Lipase 28 11 - 51 U/L    Comment: Performed at [facility] [address] [place]  [id number] Comprehensive metabolic panel     Status: None   Collection Time: 04/21/18 11:51 AM  Result Value Ref Range   Sodium 138 135 - 145 mmol/L   Potassium 4.3 3.5 - 5.1 mmol/L   Chloride 107 98 -  111 mmol/L   CO2 22 22 - 32 mmol/L   Glucose, Bld 89 70 - 99 mg/dL   BUN 8 6 - 20 mg/dL   Creatinine, Ser 0.71 0.44 - 1.00 mg/dL   Calcium 9.8 8.9 - 10.3 mg/dL   Total Protein 8.1 6.5 - 8.1 g/dL   Albumin 4.3 3.5 - 5.0 g/dL   AST 19 15 - 41 U/L   ALT 18 0 - 44 U/L   Alkaline Phosphatase 80 38 - 126 U/L   Total Bilirubin 0.7 0.3 - 1.2 mg/dL   GFR calc non Af Amer >60 >60 mL/min   GFR calc Af Amer >60 >60 mL/min    Comment: (NOTE) The eGFR has been calculated using the CKD EPI equation. This calculation has not been validated in all clinical situations. eGFR's persistently <60 mL/min signify  possible Chronic Kidney Disease.    Anion gap 9 5 - 15    Comment: Performed at [facility] [address], [place], [place] [address]  CBC     Status: None   Collection Time: 04/21/18 11:51 AM  Result Value Ref Range   WBC 6.4 4.0 - 10.5 K/uL   RBC 4.96 3.87 - 5.11 MIL/uL   Hemoglobin 14.5 12.0 - 15.0 g/dL   HCT 45.6 36.0 - 46.0 %   MCV 91.9 80.0 - 100.0 fL   MCH 29.2 26.0 - 34.0 pg   MCHC 31.8 30.0 - 36.0 g/dL   RDW 12.0 11.5 - 15.5 %   Platelets 340 150 - 400 K/uL   nRBC 0.0 0.0 - 0.2 %    Comment: Performed at [facility], [place] [address], [place], [place] [address]  I-Stat beta hCG blood, ED     Status: None   Collection Time: 04/21/18 11:56 AM  Result Value Ref Range   I-stat hCG, quantitative <5.0 <5 mIU/mL   Comment 3            Comment:   GEST. AGE      CONC.  (mIU/mL)   <=1 WEEK        5 - 50     2 WEEKS       50 - 500     3 WEEKS       100 - 10,000     4 WEEKS     1,000 - 30,000        FEMALE AND NON-PREGNANT FEMALE:     LESS THAN 5 mIU/mL   Urinalysis, Routine w reflex microscopic     Status: Abnormal   Collection Time: 04/21/18 11:57 AM  Result Value Ref Range   Color, Urine YELLOW YELLOW   APPearance CLEAR CLEAR   Specific Gravity, Urine 1.014 1.005 - 1.030   pH 8.0 5.0 - 8.0   Glucose, UA NEGATIVE NEGATIVE mg/dL   Hgb urine dipstick NEGATIVE NEGATIVE   Bilirubin Urine NEGATIVE NEGATIVE   Ketones, ur NEGATIVE NEGATIVE mg/dL   Protein, ur NEGATIVE NEGATIVE mg/dL   Nitrite NEGATIVE NEGATIVE   Leukocytes, UA TRACE (A) NEGATIVE   RBC / HPF 0-5 0 - 5 RBC/hpf   WBC, UA 0-5 0 - 5 WBC/hpf   Bacteria, UA RARE (A) NONE SEEN   Squamous Epithelial / LPF 0-5 0 - 5    Comment: Performed at [facility], [place] [address], [place], [place] [address]  Wet prep, genital     Status: Abnormal   Collection Time: 04/21/18  1:15 PM  Result  Value Ref Range   Yeast Wet Prep HPF POC NONE SEEN NONE SEEN   Trich, Wet Prep NONE SEEN NONE SEEN   Clue Cells Wet  Prep HPF POC NONE SEEN NONE SEEN   WBC, Wet Prep HPF POC MODERATE (A) NONE SEEN   Sperm NONE SEEN     Comment: Performed at [facility], [place] [address] [place], [place] [address]    US Pelvis Complete  Result Date: 04/21/2018 CLINICAL DATA:  Initial evaluation for acute right-sided pelvic pain for 1 day. EXAM: TRANSABDOMINAL ULTRASOUND OF PELVIS DOPPLER ULTRASOUND OF OVARIES TECHNIQUE: Transabdominal ultrasound examination of the pelvis was performed including evaluation of the uterus, ovaries, adnexal regions, and pelvic cul-de-sac. Color and duplex Doppler ultrasound was utilized to evaluate blood flow to the ovaries. COMPARISON:  Prior CT from earlier same day. FINDINGS: Uterus Measurements: 5.5 x 2.5 x 4.1 cm. No fibroids or other mass visualized. Endometrium Thickness: 5.5 mm.  No focal abnormality visualized. Right ovary Measurements: 4.5 x 2.3 x 2.0 cm. Normal appearance/no adnexal mass. Left ovary Measurements: 4.5 x 1.3 x 2.5 cm. Normal appearance/no adnexal mass. Pulsed Doppler evaluation demonstrates normal low-resistance arterial and venous waveforms in both ovaries. Other: No free fluid within the pelvis. IMPRESSION: Negative pelvic ultrasound. No evidence for torsion or other acute abnormality. Electronically Signed   By: Jeannine Boga M.D.   On: 04/21/2018 14:45   Ct Abdomen Pelvis W Contrast  Result Date: 04/21/2018 CLINICAL DATA:  Abdominal pain with nausea and vomiting starting last evening. Tenderness to palpation. Pain in the right upper and lower quadrant. EXAM: CT ABDOMEN AND PELVIS WITH CONTRAST TECHNIQUE: Multidetector CT imaging of the abdomen and pelvis was performed using the standard protocol following bolus administration of intravenous contrast. CONTRAST:  114m OMNIPAQUE IOHEXOL 300 MG/ML  SOLN COMPARISON:  None. FINDINGS: Lower chest: No acute abnormality. Hepatobiliary: Mild fatty infiltration along the falciform. Otherwise, the liver is unremarkable as  is the gallbladder. Pancreas: Normal Spleen: Normal Adrenals/Urinary Tract: Normal Stomach/Bowel: Abnormal appearance of the appendix with periappendiceal inflammatory change noted, series 3/56 Appendix: Location: Right hemipelvis overlying the psoas. Diameter: 8 mm Appendicolith: None Mucosal hyper-enhancement: Yes Extraluminal gas: None Periappendiceal collection: None No other acute abnormality of the gastrointestinal system. Vascular/Lymphatic: No significant vascular findings are present. No enlarged abdominal or pelvic lymph nodes. Reproductive: Small follicles noted of both ovaries. The uterus is unremarkable. Other: Tiny periumbilical fat containing hernia. No abdominopelvic ascites. Musculoskeletal: No acute or significant osseous findings. IMPRESSION: Acute uncomplicated appendicitis as above. Electronically Signed   By: DAshley RoyaltyM.D.   On: 04/21/2018 14:11   UKoreaArt/ven Flow Abd Pelv Doppler  Result Date: 04/21/2018 CLINICAL DATA:  Initial evaluation for acute right-sided pelvic pain for 1 day. EXAM: TRANSABDOMINAL ULTRASOUND OF PELVIS DOPPLER ULTRASOUND OF OVARIES TECHNIQUE: Transabdominal ultrasound examination of the pelvis was performed including evaluation of the uterus, ovaries, adnexal regions, and pelvic cul-de-sac. Color and duplex Doppler ultrasound was utilized to evaluate blood flow to the ovaries. COMPARISON:  Prior CT from earlier same day. FINDINGS: Uterus Measurements: 5.5 x 2.5 x 4.1 cm. No fibroids or other mass visualized. Endometrium Thickness: 5.5 mm.  No focal abnormality visualized. Right ovary Measurements: 4.5 x 2.3 x 2.0 cm. Normal appearance/no adnexal mass. Left ovary Measurements: 4.5 x 1.3 x 2.5 cm. Normal appearance/no adnexal mass. Pulsed Doppler evaluation demonstrates normal low-resistance arterial and venous waveforms in both ovaries. Other: No free fluid within the pelvis. IMPRESSION: Negative pelvic ultrasound. No evidence for  torsion or other acute abnormality.  Electronically Signed   By: Jeannine Boga M.D.   On: 04/21/2018 14:45    Review of Systems  Constitutional: Negative.   HENT: Negative.   Eyes: Negative.   Respiratory: Negative.   Cardiovascular: Negative.   Gastrointestinal: Negative.   Genitourinary: Negative.   Musculoskeletal: Negative.   Skin: Negative.    Blood pressure 136/87, pulse 83, temperature (!) 97.5 F (36.4 C), temperature source Oral, resp. rate 19, SpO2 99 %. Physical Exam  Constitutional: She is oriented to person, place, and time. She appears well-developed and well-nourished. No distress.  HENT:  Head: Normocephalic.  Mouth/Throat: Oropharynx is clear and moist.  Eyes: Right eye exhibits no discharge. Left eye exhibits no discharge. No scleral icterus.  Pupils are equal  Neck: Normal range of motion. Neck supple. No JVD present. No tracheal deviation present. No thyromegaly present.  Cardiovascular: Normal rate, regular rhythm, normal heart sounds and intact distal pulses.  No murmur heard. Respiratory: Effort normal and breath sounds normal. No respiratory distress. She has no wheezes. She has no rales. She exhibits no tenderness.  GI: Soft. Bowel sounds are normal. She exhibits no distension and no mass. There is tenderness (Right lower quadrant). There is no rebound and no guarding.  Musculoskeletal: She exhibits no edema or tenderness.  Lymphadenopathy:    She has no cervical adenopathy.  Neurological: She is alert and oriented to person, place, and time. No cranial nerve deficit.  Skin: Skin is warm and dry. No rash noted. She is not diaphoretic. No erythema. No pallor.  Psychiatric: She has a normal mood and affect. Her behavior is normal. Judgment and thought content normal.    Assessment/Plan: Acute appendicitis Hx juvenile myoclonic epilepsy -seizure 04/13/2018  Plan: Admit, IV antibiotics, IV hydration, surgery later this evening.    Christina Morales 04/21/2018, 4:27 PM

## 2018-04-21 NOTE — ED Notes (Signed)
Pt went to US from CT

## 2018-04-21 NOTE — ED Triage Notes (Signed)
Pt in c/o abdominal pain with n/v, started last night, tender to palpation, no distress noted

## 2018-04-22 ENCOUNTER — Encounter (INDEPENDENT_AMBULATORY_CARE_PROVIDER_SITE_OTHER): Payer: Self-pay

## 2018-04-22 ENCOUNTER — Encounter (HOSPITAL_COMMUNITY): Payer: Self-pay | Admitting: Surgery

## 2018-04-22 ENCOUNTER — Other Ambulatory Visit: Payer: Self-pay

## 2018-04-22 LAB — GC/CHLAMYDIA PROBE AMP (~~LOC~~) NOT AT ARMC
CHLAMYDIA, DNA PROBE: NEGATIVE
Neisseria Gonorrhea: NEGATIVE

## 2018-04-22 MED ORDER — OXYCODONE HCL 5 MG PO TABS: 5.0000 mg | ORAL_TABLET | ORAL | 0 refills | Status: DC | PRN

## 2018-04-22 MED ORDER — ACETAMINOPHEN 500 MG PO TABS: 1000.0000 mg | ORAL_TABLET | Freq: Four times a day (QID) | ORAL | 0 refills | Status: DC | PRN

## 2018-04-22 MED ORDER — INFLUENZA VAC SPLIT QUAD 0.5 ML IM SUSY: 0.5000 mL | PREFILLED_SYRINGE | INTRAMUSCULAR | Status: DC

## 2018-04-22 NOTE — Progress Notes (Signed)
Discharged home today accompanied by patient's brother. Discharged instructions,personal belongings given to patient.Verbalized understanding of instructions

## 2018-04-22 NOTE — Discharge Instructions (Signed)
[facility] ° °Please arrive at least 30 min before your appointment to complete your check in paperwork.  If you are unable to arrive 30 min prior to your appointment time we may have to cancel or reschedule you. °LAPAROSCOPIC SURGERY: POST OP INSTRUCTIONS °Always review your discharge instruction sheet given to you by the facility where your surgery was performed. °IF YOU HAVE DISABILITY OR FAMILY LEAVE FORMS, YOU MUST BRING THEM TO THE OFFICE FOR PROCESSING.   °DO NOT GIVE THEM TO YOUR DOCTOR. ° °PAIN CONTROL ° °1. First take acetaminophen (Tylenol) AND/or ibuprofen (Advil) to control your pain after surgery.  Follow directions on package.  Taking acetaminophen (Tylenol) and/or ibuprofen (Advil) regularly after surgery will help to control your pain and lower the amount of prescription pain medication you may need.  You should not take more than 4,000 mg (4 grams) of acetaminophen (Tylenol) in 24 hours.  You should not take ibuprofen (Advil), aleve, motrin, naprosyn or other NSAIDS if you have a history of stomach ulcers or chronic kidney disease.  °2. A prescription for pain medication may be given to you upon discharge.  Take your pain medication as prescribed, if you still have uncontrolled pain after taking acetaminophen (Tylenol) or ibuprofen (Advil). °3. Use ice packs to help control pain. °4. If you need a refill on your pain medication, please contact your pharmacy.  They will contact our office to request authorization. Prescriptions will not be filled after 5pm or on week-ends. ° °HOME MEDICATIONS °5. Take your usually prescribed medications unless otherwise directed. ° °DIET °6. You should follow a light diet the first few days after arrival home.  Be sure to include lots of fluids daily. Avoid fatty, fried foods.  ° °CONSTIPATION °7. It is common to experience some constipation after surgery and if you are taking pain medication.  Increasing fluid intake and taking a stool  softener (such as Colace) will usually help or prevent this problem from occurring.  A mild laxative (Milk of Magnesia or Miralax) should be taken according to package instructions if there are no bowel movements after 48 hours. ° °WOUND/INCISION CARE °8. Most patients will experience some swelling and bruising in the area of the incisions.  Ice packs will help.  Swelling and bruising can take several days to resolve.  °9. Unless discharge instructions indicate otherwise, follow guidelines below  °a. STERI-STRIPS - you may remove your outer bandages 48 hours after surgery, and you may shower at that time.  You have steri-strips (small skin tapes) in place directly over the incision.  These strips should be left on the skin for 7-10 days.   °b. DERMABOND/SKIN GLUE - you may shower in 24 hours.  The glue will flake off over the next 2-3 weeks. °10. Any sutures or staples will be removed at the office during your follow-up visit. ° °ACTIVITIES °11. You may resume regular (light) daily activities beginning the next day--such as daily self-care, walking, climbing stairs--gradually increasing activities as tolerated.  You may have sexual intercourse when it is comfortable.  Refrain from any heavy lifting or straining until approved by your doctor. °a. You may drive when you are no longer taking prescription pain medication, you can comfortably wear a seatbelt, and you can safely maneuver your car and apply brakes. ° °FOLLOW-UP °12. You should see your doctor in the office for a follow-up appointment approximately 2-3 weeks after your surgery.  You should have been given your post-op/follow-up appointment when   your surgery was scheduled.  If you did not receive a post-op/follow-up appointment, make sure that you call for this appointment within a day or two after you arrive home to insure a convenient appointment time. ° °OTHER INSTRUCTIONS ° °WHEN TO CALL YOUR DOCTOR: °1. Fever over 101.0 °2. Inability to  urinate °3. Continued bleeding from incision. °4. Increased pain, redness, or drainage from the incision. °5. Increasing abdominal pain ° °The clinic staff is available to answer your questions during regular business hours.  Please don’t hesitate to call and ask to speak to one of the nurses for clinical concerns.  If you have a medical emergency, go to the nearest emergency room or call 911.  A surgeon from [facility] is always on call at the hospital. °[address], [address], [place], [place]  [address] ? [address] [address], [place],    [address] °[phone] ? [phone] ? FAX [phone] ° ° ° °

## 2018-04-22 NOTE — Telephone Encounter (Signed)
Letter mailed

## 2018-04-22 NOTE — Telephone Encounter (Signed)
Called and vm was full. Please send unable to contact letter.

## 2018-04-22 NOTE — Discharge Summary (Signed)
     Patient ID: Christina Morales [id number] 2000-12-15 17 y.o.  Admit date: 04/21/2018 Discharge date: 04/22/2018  Admitting Diagnosis: Acute appendicitis  Discharge Diagnosis Patient Active Problem List   Diagnosis Date Noted  . Acute appendicitis 04/21/2018  . 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 Juvenile myoclonic epilepsy (HCC) 10/06/2013    Consultants none  Reason for Admission: 17 year old presents with nausea and vomitingthat started last evening. She developed abdominal painand a headache this AM. Pain is rated as 8/10,and is in the right lower quadrant.Work-up in the ED shows she is afebrile and vital signs are stable. Labs are all essentially normal.WBC is 6.4. Pelvic ultrasound is negative with no evidence of torsion or other acute abnormality. CT shows hyperenhancement and suggest acute appendicitis.She remains extremely tender in the right lower quadrant, no further nausea or vomiting. We are asked to see.  She has a history of seizures. She had been weaned of her Keppra by her Neurologist and had a seizure on 04/13/18.She was seen in the ED here and was started back on her seizure medications. She reports no seizures for over 5 years prior to the episode last week. No issues with seizures since her seizure last week.  Procedures Lap appy, Dr. [name], 04-21-18  Hospital Course:  The patient was admitted and underwent a laparoscopic appendectomy.  The patient tolerated the procedure well.  On POD 1, the patient was tolerating a regular diet, voiding well, mobilizing, and pain was controlled with oral pain medications.  The patient was stable for DC home at this time with appropriate follow up made.    Physical Exam: Abd: soft, appropriately tender, but minimal, +BS, ND, incisions c/d/i except some old bloody drainage on umbilical incision gauze.  Allergies as of 04/22/2018      Reactions   Dairy Aid [lactase] Nausea And Vomiting      Medication List    STOP taking these medications   naproxen sodium 220 MG tablet Commonly known as:  ALEVE     TAKE these medications   acetaminophen 500 MG tablet Commonly known as:  TYLENOL Take 2 tablets (1,000 mg total) by mouth every 6 (six) hours as needed.   ibuprofen 200 MG tablet Commonly known as:  ADVIL,MOTRIN Take 400 mg by mouth daily as needed for headache.   levETIRAcetam 500 MG 24 hr tablet Commonly known as:  KEPPRA XR Take 2 tablets (1,000 mg total) by mouth daily. At night What changed:    when to take this  additional instructions   ondansetron 4 MG disintegrating tablet Commonly known as:  ZOFRAN-ODT 8mg  ODT q8 hours prn nausea   oxyCODONE 5 MG immediate release tablet Commonly known as:  Oxy IR/ROXICODONE Take 1 tablet (5 mg total) by mouth every 4 (four) hours as needed for moderate pain.        Follow-up Information    [facility].   Specialty:  General Surgery Why:  Your appointment is at *.  Be at the office 30 minutes early for check in.  Bring photo ID and insurance information. Contact information: [address] [place] [address] [phone]           Signed: Barnetta Chapel, [facility] 04/22/2018, 10:05 AM Pager: [phone]

## 2018-04-23 NOTE — Anesthesia Postprocedure Evaluation (Signed)
Anesthesia Post Note  Patient: Christina Morales  Procedure(s) Performed: APPENDECTOMY LAPAROSCOPIC (N/A Abdomen)     Patient location during evaluation: PACU Anesthesia Type: General Level of consciousness: awake and alert Pain management: pain level controlled Vital Signs Assessment: post-procedure vital signs reviewed and stable Respiratory status: spontaneous breathing, nonlabored ventilation, respiratory function stable and patient connected to nasal cannula oxygen Cardiovascular status: blood pressure returned to baseline and stable Postop Assessment: no apparent nausea or vomiting Anesthetic complications: no    Last Vitals:  Vitals:   04/22/18 0207 04/22/18 0953  BP: (!) 95/56 108/69  Pulse: 65 65  Resp: 19 18  Temp: 36.6 C 36.8 C  SpO2: 99% 100%    Last Pain:  Vitals:   04/22/18 0953  TempSrc: Oral  PainSc:                  [name]

## 2018-04-27 ENCOUNTER — Ambulatory Visit (INDEPENDENT_AMBULATORY_CARE_PROVIDER_SITE_OTHER): Payer: Medicaid Other | Admitting: Neurology

## 2018-04-27 ENCOUNTER — Encounter (INDEPENDENT_AMBULATORY_CARE_PROVIDER_SITE_OTHER): Payer: Self-pay | Admitting: Neurology

## 2018-04-27 VITALS — BP 106/64 | HR 94 | Ht 63.75 in | Wt 147.5 lb

## 2018-04-27 DIAGNOSIS — G40B09 Juvenile myoclonic epilepsy, not intractable, without status epilepticus: Secondary | ICD-10-CM | POA: Diagnosis not present

## 2018-04-27 MED ORDER — LEVETIRACETAM ER 500 MG PO TB24: 1000.0000 mg | ORAL_TABLET | Freq: Every day | ORAL | 5 refills | Status: DC

## 2018-04-27 NOTE — Progress Notes (Signed)
Patient: Christina Morales MRN: [id number] Sex: female DOB: 03-Sep-2000  Provider: Keturah Shavers, MD Location of Care: [facility]  Note type: Routine return visit  Referral Source: Pricilla Loveless, MD History from: patient Chief Complaint: Seizure  History of Present Illness: Christina Morales is a 17 y.o. female is here for follow-up management of seizure disorder.  She has history of juvenile myoclonic epilepsy and has been seen and followed for the past few years.  She had been on Keppra for a few years with no clinical seizure activity for more than 4 years and since her EEG was normal, on her last visit in July she was recommended to gradually taper and discontinue medication. She was off of medication for probably a month or so when she had an episode of collapse and loss of consciousness when she was taking shower with some stiffening and drooling but no jerking or shaking activity. She was seen in the emergency room and we have started on previous dose of Keppra which was 1000 mg daily.  She underwent an EEG on 04/15/2018 which revealed a few episodes of sporadic spikes and sharps, generalized or in the frontocentral area. As per patient she is doing well and she has had no other episodes since then although she has been having occasional brief myoclonic jerks off and on but they were very subtle and occasional.  She was in the hospital last week with acute appendicitis and had surgery last week.   Review of Systems: 12 system review as per HPI, otherwise negative.  Past Medical History:  Diagnosis Date  . Seizures (HCC)    Hospitalizations: No., Head Injury: No., Nervous System Infections: No., Immunizations up to date: Yes.    Surgical History Past Surgical History:  Procedure Laterality Date  . LAPAROSCOPIC APPENDECTOMY N/A 04/21/2018   Procedure: APPENDECTOMY LAPAROSCOPIC;  Surgeon: Manus Rudd, MD;  Location: [facility] OR;  Service: General;  Laterality: N/A;     Family History family history is not on file.   Social History Social History   Socioeconomic History  . Marital status: Single    Spouse name: Not on file  . Number of children: Not on file  . Years of education: Not on file  . Highest education level: Not on file  Occupational History  . Not on file  Social Needs  . Financial resource strain: Not on file  . Food insecurity:    Worry: Not on file    Inability: Not on file  . Transportation needs:    Medical: Not on file    Non-medical: Not on file  Tobacco Use  . Smoking status: Never Smoker  . Smokeless tobacco: Never Used  Substance and Sexual Activity  . Alcohol use: No  . Drug use: No  . Sexual activity: Not on file  Lifestyle  . Physical activity:    Days per week: Not on file    Minutes per session: Not on file  . Stress: Not on file  Relationships  . Social connections:    Talks on phone: Not on file    Gets together: Not on file    Attends religious service: Not on file    Active member of club or organization: Not on file    Attends meetings of clubs or organizations: Not on file    Relationship status: Not on file  Other Topics Concern  . 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  She lives with her parents and siblings.    She enjoys theatre and video games.   She is only working at the moment.     The medication list was reviewed and reconciled. All changes or newly prescribed medications were explained.  A complete medication list was provided to the patient/caregiver.  Allergies  Allergen Reactions  . Dairy Aid [Lactase] Nausea And Vomiting    Physical Exam BP 106/64   Pulse 94   Ht 5' 3.75" (1.619 m)   Wt 147 lb 7.8 oz (66.9 kg)   LMP  (LMP Unknown)   BMI 25.52 kg/m  Gen: Awake, alert, not in distress Skin: No rash, No neurocutaneous stigmata. HEENT: Normocephalic,  no conjunctival injection, nares patent, mucous membranes moist, oropharynx  clear. Neck: Supple, no meningismus. No focal tenderness. Resp: Clear to auscultation bilaterally CV: Regular rate, normal S1/S2, no murmurs, no rubs Abd: abdomen soft, non-tender, non-distended. No hepatosplenomegaly or mass Ext: Warm and well-perfused. No deformities, no muscle wasting,   Neurological Examination: MS: Awake, alert, interactive. Normal eye contact, answered the questions appropriately, speech was fluent,  Normal comprehension.  Attention and concentration were normal. Cranial Nerves: Pupils were equal and reactive to light ( 5-23mm);  normal fundoscopic exam with sharp discs, visual field full with confrontation test; EOM normal, no nystagmus; no ptsosis, no double vision, intact facial sensation, face symmetric with full strength of facial muscles, hearing intact to finger rub bilaterally, palate elevation is symmetric, tongue protrusion is symmetric with full movement to both sides.  Sternocleidomastoid and trapezius are with normal strength. Tone-Normal Strength-Normal strength in all muscle groups DTRs-  Biceps Triceps Brachioradialis Patellar Ankle  R 2+ 2+ 2+ 2+ 2+  L 2+ 2+ 2+ 2+ 2+   Plantar responses flexor bilaterally, no clonus noted Sensation: Intact to light touch,  Romberg negative. Coordination: No dysmetria on FTN test. No difficulty with balance. Gait: Normal walk and run. Tandem gait was normal. Was able to perform toe walking and heel walking without difficulty.   Assessment and Plan 1. Juvenile myoclonic epilepsy, not intractable, without status epilepticus (HCC)    This is a 17 year old female with diagnosis of juvenile myoclonic epilepsy who had been seizure-free for a few years with normal EEG so she was taken off of Keppra for a few months but she did have another clinical seizure activity and restarted on Keppra and her EEG showed sporadic episodes of discharges as described. I discussed with patient that she would most likely need to continue  Keppra for now with the same low dose and if there are more seizure activity then we might need to increase the dose of medication. I also discussed again regarding the seizure triggers particularly lack of sleep and bright light.  Currently she is not driving and she does not have driving license. I would like to see her in 6 months for follow-up visit or sooner if she develops more seizure activity. She understood and agreed with the plan.  Meds ordered this encounter  Medications  . levETIRAcetam (KEPPRA XR) 500 MG 24 hr tablet    Sig: Take 2 tablets (1,000 mg total) by mouth at bedtime.    Dispense:  60 tablet    Refill:  5

## 2018-10-26 ENCOUNTER — Ambulatory Visit (INDEPENDENT_AMBULATORY_CARE_PROVIDER_SITE_OTHER): Payer: Medicaid Other | Admitting: Neurology

## 2018-11-10 ENCOUNTER — Ambulatory Visit (INDEPENDENT_AMBULATORY_CARE_PROVIDER_SITE_OTHER): Payer: Medicaid Other | Admitting: Neurology

## 2018-11-10 ENCOUNTER — Encounter (INDEPENDENT_AMBULATORY_CARE_PROVIDER_SITE_OTHER): Payer: Self-pay | Admitting: Neurology

## 2018-11-10 ENCOUNTER — Other Ambulatory Visit: Payer: Self-pay

## 2018-11-10 DIAGNOSIS — G40B09 Juvenile myoclonic epilepsy, not intractable, without status epilepticus: Secondary | ICD-10-CM

## 2018-11-10 MED ORDER — LEVETIRACETAM ER 500 MG PO TB24: 1000.0000 mg | ORAL_TABLET | Freq: Every day | ORAL | 5 refills | Status: DC

## 2018-11-10 NOTE — Progress Notes (Signed)
This is a Pediatric Specialist E-Visit follow up consult provided via WebEx Christina Morales consented to an E-Visit consult today.  Location of patient: Christina Morales is at home  Location of provider: Dr Christina Morales is in office Patient was referred by No ref. provider found   The following participants were involved in this E-Visit:  Christina Morales, [place] Christina Morales Dr Christina Morales  Chief Complain/ Reason for E-Visit today: Seizures Total time on call: 25 minutes Follow up: 6 months  Patient: Christina Morales MRN: [id number] Sex: female DOB: 2001-06-13  Provider: Keturah Shavers, MD Location of Care: [facility]  Note type: Routine return visit  Referral Source: Pricilla Loveless, MD History from: patient and [facility] chart Chief Complaint: Seizures  History of Present Illness: Christina Morales Christina Morales is a 18 y.o. female is on WebEx for follow-up management of seizure disorder.  She has a diagnosis of juvenile myoclonic epilepsy for the past several years and has been on Keppra with good seizure control and she had no clinical seizure activity for 4 years and her EEG was normal so she was tried off of medication for a while but she had another clinical seizure activity prior to her last visit in October 2019 and her EEG showed a few episodes of spikes and sharps so patient was started on Keppra again and currently she is on 1000 mg Keppra ER every night and since her last visit in October she has not had any clinical seizure activity and has been tolerating medication well with no side effects. Overall she has been doing well and has no other complaints and currently she is working in [employer].  She usually sleeps well without any difficulty.  She has no mood or behavioral issues.  Review of Systems: 12 system review as per HPI, otherwise negative.  Past Medical History:  Diagnosis Date  . Seizures (HCC)    Hospitalizations: No., Head Injury: No., Nervous System Infections: No., Immunizations up to date:  Yes.    Surgical History Past Surgical History:  Procedure Laterality Date  . LAPAROSCOPIC APPENDECTOMY N/A 04/21/2018   Procedure: APPENDECTOMY LAPAROSCOPIC;  Surgeon: Manus Rudd, MD;  Location: [facility] OR;  Service: General;  Laterality: N/A;    Family History family history is not on file.   Social History Social History   Socioeconomic History  . Marital status: Single    Spouse name: Not on file  . Number of children: Not on file  . Years of education: Not on file  . Highest education level: Not on file  Occupational History  . Not on file  Social Needs  . Financial resource strain: Not on file  . Food insecurity:    Worry: Not on file    Inability: Not on file  . Transportation needs:    Medical: Not on file    Non-medical: Not on file  Tobacco Use  . Smoking status: Never Smoker  . Smokeless tobacco: Never Used  Substance and Sexual Activity  . Alcohol use: No  . Drug use: No  . Sexual activity: Not on file  Lifestyle  . Physical activity:    Days per week: Not on file    Minutes per session: Not on file  . Stress: Not on file  Relationships  . Social connections:    Talks on phone: Not on file    Gets together: Not on file    Attends religious service: Not on file    Active member of club or organization: Not on  file    Attends meetings of clubs or organizations: Not on file    Relationship status: Not on file  Other Topics Concern  . 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   She lives with her parents and siblings.    She enjoys theatre and video games.   She is only working at the moment.     The medication list was reviewed and reconciled. All changes or newly prescribed medications were explained.  A complete medication list was provided to the patient/caregiver.  Allergies  Allergen Reactions  . Dairy Aid [Lactase] Nausea And Vomiting    Physical Exam There  were no vitals taken for this visit. Her limited neurological exam on WebEx is normal.  She was awake and alert, following instructions appropriately with normal comprehension and fluent speech.  She had normal cranial nerve exam.  She had normal walk with normal coordination and no difficulty with her balance.  She had normal finger-to-nose testing.  She had no tremor.  She had normal range of motion with no limitation of activity.  Assessment and Plan 1. Juvenile myoclonic epilepsy, not intractable, without status epilepticus (HCC)    This is a 18 year old female with diagnosis of juvenile myoclonic epilepsy which was tried off of medication after being seizure-free for 4 years but she had another clinical episode and her EEG was slightly abnormal so she was started on low-dose Keppra, currently 1000 mg every night with no more seizure activity. Recommend to continue the same dose of Keppra ER 1000 mg every night I discussed with patient again regarding the importance of adequate sleep and limited screen time to prevent from more seizure activity I do not think she needs follow-up EEG at this time.  I would like to see her in 6 months for follow-up visit and after her next visit I may repeat her EEG.  I sent a new prescription for Keppra.  She understood and agreed with the plan.   Meds ordered this encounter  Medications  . levETIRAcetam (KEPPRA XR) 500 MG 24 hr tablet    Sig: Take 2 tablets (1,000 mg total) by mouth at bedtime for 30 days.    Dispense:  60 tablet    Refill:  5

## 2019-05-06 ENCOUNTER — Other Ambulatory Visit (INDEPENDENT_AMBULATORY_CARE_PROVIDER_SITE_OTHER): Payer: Self-pay | Admitting: Neurology

## 2019-05-06 ENCOUNTER — Telehealth (INDEPENDENT_AMBULATORY_CARE_PROVIDER_SITE_OTHER): Payer: Self-pay | Admitting: Neurology

## 2019-05-06 MED ORDER — LEVETIRACETAM ER 500 MG PO TB24: 1000.0000 mg | ORAL_TABLET | Freq: Every day | ORAL | 0 refills | Status: DC

## 2019-05-06 NOTE — Telephone Encounter (Signed)
Who's calling (name and relationship to patient) : Christina Morales (self)  Best contact number: [phone]  Provider they see: Dr. Secundino Ginger  Reason for call:  PT called in requesting a refill for her Keppra.   Call ID:      PRESCRIPTION REFILL ONLY  Name of prescription: [place]: [facility]

## 2019-05-11 ENCOUNTER — Other Ambulatory Visit: Payer: Self-pay

## 2019-05-11 ENCOUNTER — Encounter (INDEPENDENT_AMBULATORY_CARE_PROVIDER_SITE_OTHER): Payer: Self-pay | Admitting: Neurology

## 2019-05-11 ENCOUNTER — Ambulatory Visit (INDEPENDENT_AMBULATORY_CARE_PROVIDER_SITE_OTHER): Payer: Medicaid Other | Admitting: Neurology

## 2019-05-11 ENCOUNTER — Ambulatory Visit (INDEPENDENT_AMBULATORY_CARE_PROVIDER_SITE_OTHER): Payer: BC Managed Care – PPO | Admitting: Neurology

## 2019-05-11 VITALS — BP 106/74 | HR 70 | Ht 63.39 in | Wt 156.7 lb

## 2019-05-11 DIAGNOSIS — G40B09 Juvenile myoclonic epilepsy, not intractable, without status epilepticus: Secondary | ICD-10-CM | POA: Diagnosis not present

## 2019-05-11 MED ORDER — LEVETIRACETAM ER 500 MG PO TB24: 1000.0000 mg | ORAL_TABLET | Freq: Every day | ORAL | 6 refills | Status: DC

## 2019-05-11 MED ORDER — LEVETIRACETAM ER 500 MG PO TB24: 1000.0000 mg | ORAL_TABLET | Freq: Every day | ORAL | 0 refills | Status: DC

## 2019-05-11 NOTE — Progress Notes (Signed)
Patient: Christina Morales MRN: [id number] Sex: female DOB: April 12, 2001  Provider: Teressa Lower, MD Location of Care: [facility]  Note type: Routine return visit  Referral Source: Sherwood Gambler, MD History from: patient and [facility] chart Chief Complaint: Seizure  History of Present Illness: Christina Morales is a 18 y.o. female is here for follow-up management of seizure disorder.  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  She did have a normal head CT. She has been on Keppra since then with no more seizure activity for several years and with normal follow-up EEG so she was tried on tapering Keppra in 2019 when she had another clinical seizure activity versus syncopal episode in October 2019 and her EEG showed some sporadic spikes and sharps in the frontocentral area. She was restarted on Keppra XR 1000 mg which is her current dose of medication and she has been doing well without having any more clinical seizure activity since then. Otherwise she has been doing well without having any other issues and has not been on any other medication, sleeping well with normal behavior and tolerating medication well with no side effects.  Review of Systems: Review of system as per HPI, otherwise negative.  Past Medical History:  Diagnosis Date  . Seizures ([place])    Hospitalizations: No., Head Injury: No., Nervous System Infections: No., Immunizations up to date: Yes.     Surgical History Past Surgical History:  Procedure Laterality Date  . LAPAROSCOPIC APPENDECTOMY N/A 04/21/2018   Procedure: APPENDECTOMY LAPAROSCOPIC;  Surgeon: Donnie Mesa, MD;  Location: [place];  Service: General;  Laterality: N/A;    Family History family history is not on file.   Social History Social History   Socioeconomic History  . Marital  status: Single    Spouse name: Not on file  . Number of children: Not on file  . Years of education: Not on file  . Highest education level: Not on file  Occupational History  . Not on file  Social Needs  . Financial resource strain: Not on file  . Food insecurity    Worry: Not on file    Inability: Not on file  . Transportation needs    Medical: Not on file    Non-medical: Not on file  Tobacco Use  . Smoking status: Never Smoker  . Smokeless tobacco: Never Used  Substance and Sexual Activity  . Alcohol use: No  . Drug use: No  . Sexual activity: Not on file  Lifestyle  . Physical activity    Days per week: Not on file    Minutes per session: Not on file  . Stress: Not on file  Relationships  . Social Herbalist on phone: Not on file    Gets together: Not on file    Attends religious service: Not on file    Active member of club or organization: Not on file    Attends meetings of clubs or organizations: Not on file    Relationship status: Not on file  Other Topics Concern  . 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   She lives with her parents and siblings.    She enjoys theatre and video games.   She is only working at  the moment.     Allergies  Allergen Reactions  . Dairy Aid [Lactase] Nausea And Vomiting    Physical Exam BP 106/74   Pulse 70   Ht 5' 3.39" (1.61 m)   Wt 156 lb 12 oz (71.1 kg)   BMI 27.43 kg/m  Gen: Awake, alert, not in distress Skin: No rash, No neurocutaneous stigmata. HEENT: Normocephalic, no dysmorphic features, no conjunctival injection, nares patent, mucous membranes moist, oropharynx clear. Neck: Supple, no meningismus. No focal tenderness. Resp: Clear to auscultation bilaterally CV: Regular rate, normal S1/S2, no murmurs, no rubs Abd: BS present, abdomen soft, non-tender, non-distended. No hepatosplenomegaly or mass Ext: Warm and  well-perfused. No deformities, no muscle wasting, ROM full.  Neurological Examination: MS: Awake, alert, interactive. Normal eye contact, answered the questions appropriately, speech was fluent,  Normal comprehension.  Attention and concentration were normal. Cranial Nerves: Pupils were equal and reactive to light ( 5-40mm);  normal fundoscopic exam with sharp discs, visual field full with confrontation test; EOM normal, no nystagmus; no ptsosis, no double vision, intact facial sensation, face symmetric with full strength of facial muscles, hearing intact to finger rub bilaterally, palate elevation is symmetric, tongue protrusion is symmetric with full movement to both sides.  Sternocleidomastoid and trapezius are with normal strength. Tone-Normal Strength-Normal strength in all muscle groups DTRs-  Biceps Triceps Brachioradialis Patellar Ankle  R 2+ 2+ 2+ 2+ 2+  L 2+ 2+ 2+ 2+ 2+   Plantar responses flexor bilaterally, no clonus noted Sensation: Intact to light touch, temperature, vibration, Romberg negative. Coordination: No dysmetria on FTN test. No difficulty with balance. Gait: Normal walk and run. Tandem gait was normal. Was able to perform toe walking and heel walking without difficulty.   Assessment and Plan 1. Juvenile myoclonic epilepsy, not intractable, without status epilepticus (HCC)    This is a 18 year old female with diagnosis of juvenile myoclonic epilepsy since 2015 for which she has been on Keppra with good seizure control and no clinical seizure activity since then except for 1 episode of possible seizure activity versus syncopal episode in 2019 when she was taken off of Keppra.  She has no focal findings on her neurological examination. Recommend to continue the same low-dose of Keppra at 1000 mg daily for now. She needs to have adequate sleep and limited screen time to prevent from more seizure activity. No need for EEG at this time since she needs to continue the  medication for now regardless of the EEG result. She would like to follow-up with adult neurology so I asked her to establish a PCP and then get a referral to see adult neurology and they will have access to our records. I told her that at some point if she remains seizure-free for the next couple of years, she might be tried 1 more time to taper and discontinue medication and see how she does although there would be a chance of having more seizure activity considering the nature of JME. I will send a prescription with several refills for now and then she needs to follow-up with adult neurology in about 6 to 8 months but if there is any question or concerns prior to that, she will call my office at any time.  She understood and agreed with the plan.  Meds ordered this encounter  Medications  . DISCONTD: levETIRAcetam (KEPPRA XR) 500 MG 24 hr tablet    Sig: Take 2 tablets (1,000 mg total) by mouth at bedtime.    Dispense:  60  tablet    Refill:  0  . levETIRAcetam (KEPPRA XR) 500 MG 24 hr tablet    Sig: Take 2 tablets (1,000 mg total) by mouth at bedtime.    Dispense:  60 tablet    Refill:  6

## 2019-05-11 NOTE — Patient Instructions (Signed)
Continue the same dose of Keppra at 1000 mg every night If there is any clinical seizure activity, call my office and let me know Continue with adequate sleep and limited screen time Establish a new primary care physician Then get a referral for adult neurology At this time no follow-up appointment needed and most likely the next follow-up would be with adult neurology.

## 2019-06-03 IMAGING — CT CT ABD-PELV W/ CM
2 of 4 series · 16 of 46 positions shown, 18 images · IV contrast (APPLIED)
Comparison: None.

CLINICAL DATA: Abdominal pain with nausea and vomiting starting
last evening. Tenderness to palpation. Pain in the right upper and
lower quadrant.

EXAM:
CT ABDOMEN AND PELVIS WITH CONTRAST
TECHNIQUE: Multidetector CT imaging of the abdomen and pelvis was performed
using the standard protocol following bolus administration of
intravenous contrast.
CONTRAST:  100mL OMNIPAQUE IOHEXOL 300 MG/ML  SOLN

[Series 3: abdomen 5.0 · axial · 0.64mm/px · z∈[+762,+1172]mm · 13 of 92 slices shown, 15 images]
[im 5/92  soft-tissue]
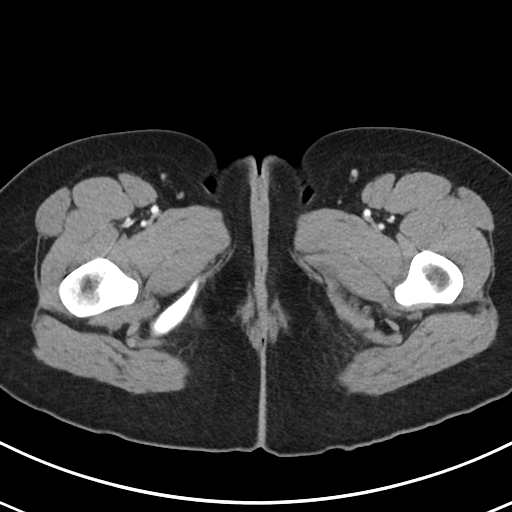
[im 5/92  bone]
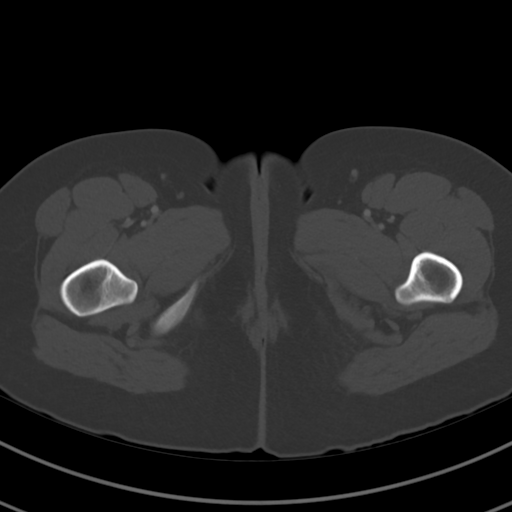
[im 14/92  soft-tissue]
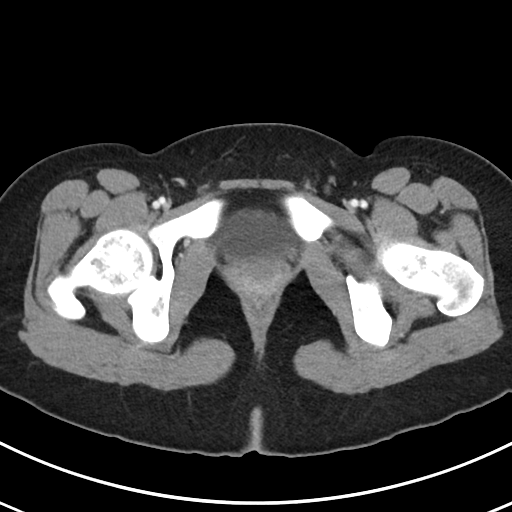
[im 19/92  soft-tissue]
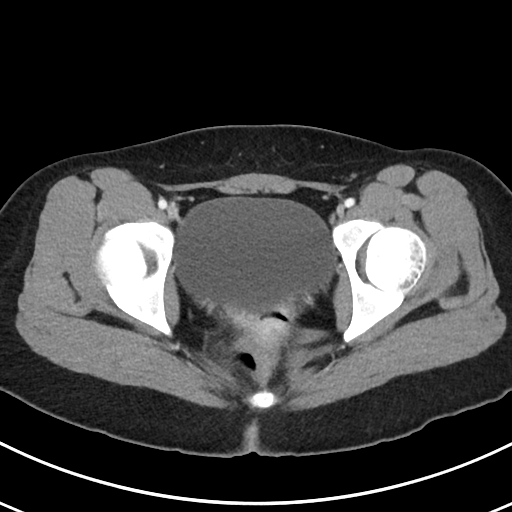
[im 28/92  soft-tissue]
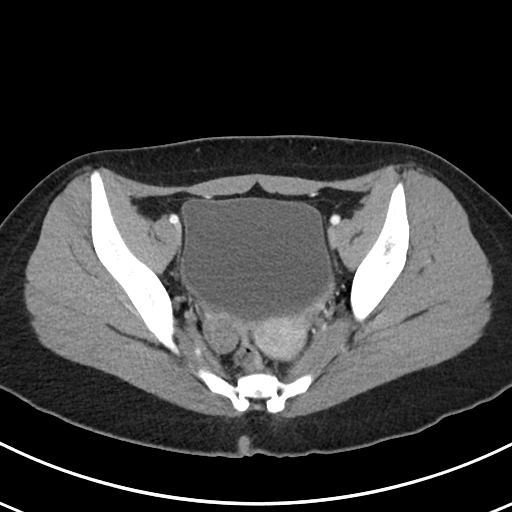
[im 32/92  soft-tissue]
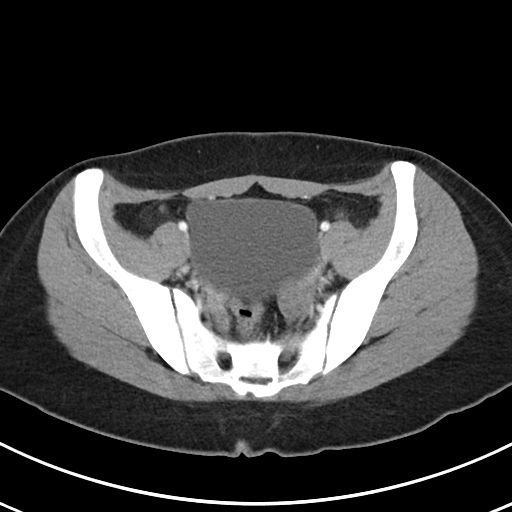
[im 41/92  soft-tissue]
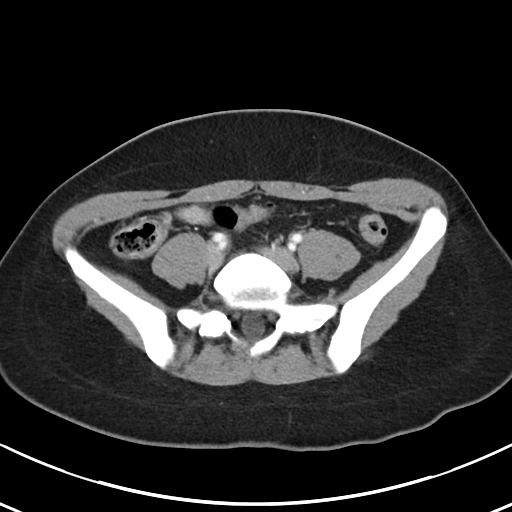
[im 46/92  soft-tissue]
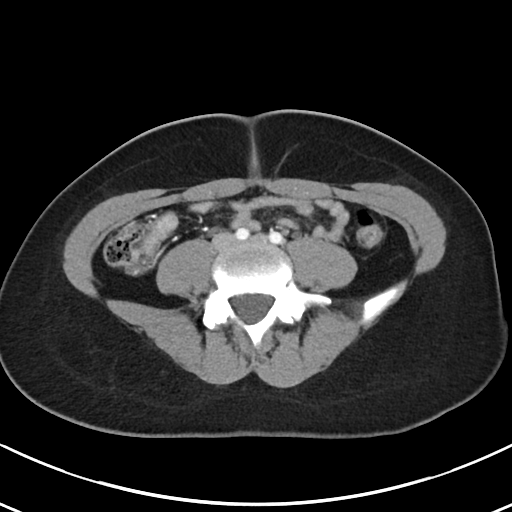
[im 51/92  soft-tissue]
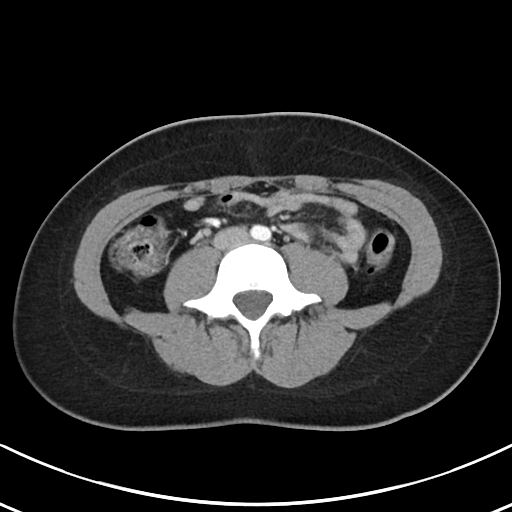
[im 60/92  soft-tissue]
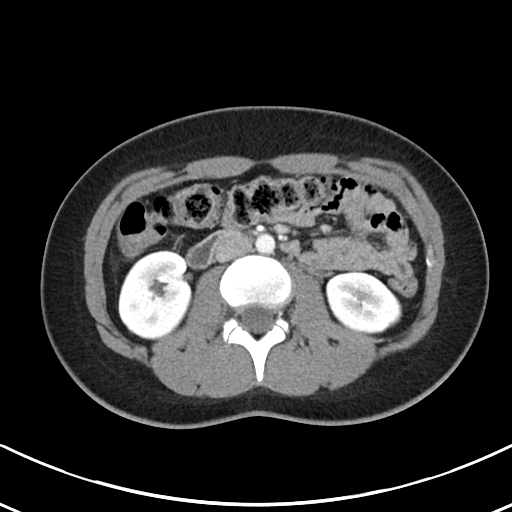
[im 60/92  bone]
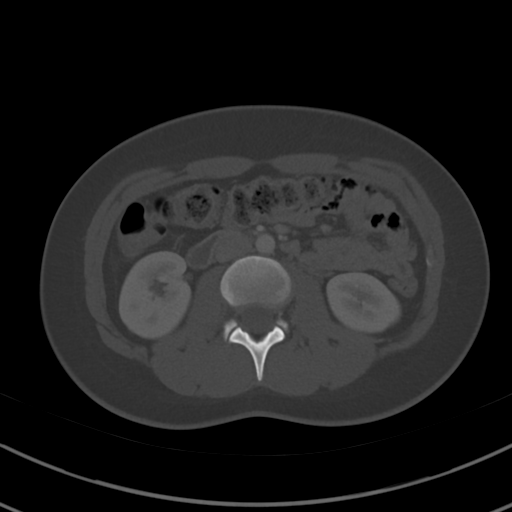
[im 64/92  soft-tissue]
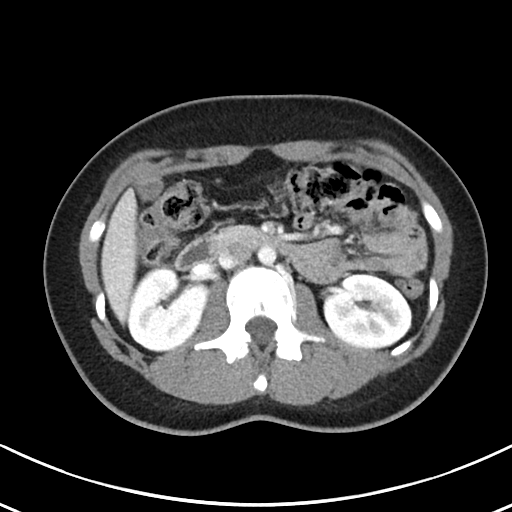
[im 73/92  soft-tissue]
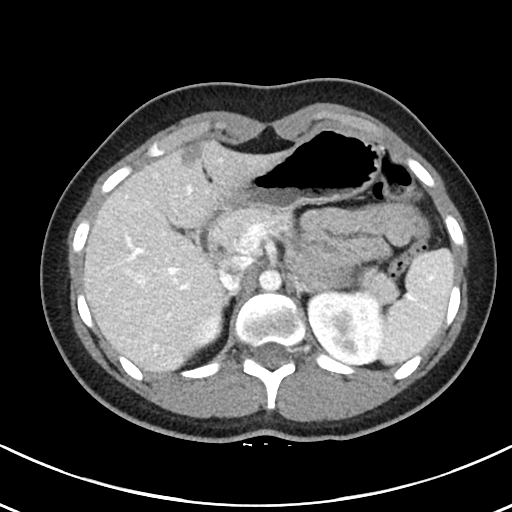
[im 78/92  soft-tissue]
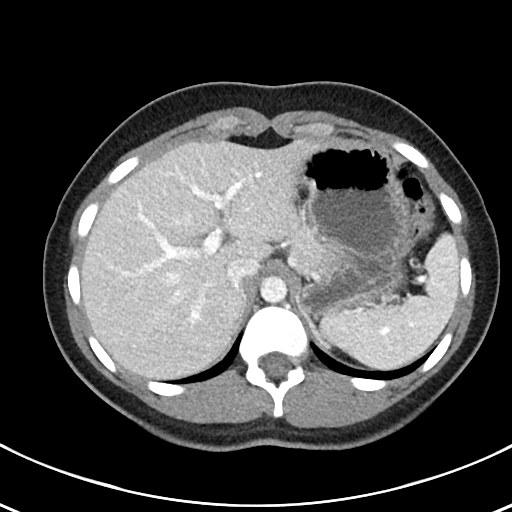
[im 87/92  soft-tissue]
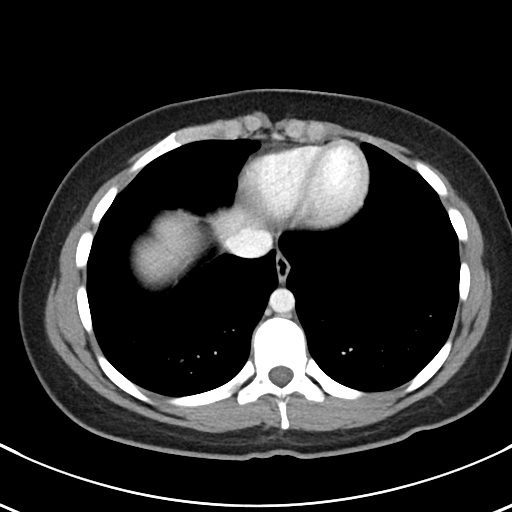

[Series 6: abdomen 3.0 mpr cor · coronal · 0.69mm/px · 3 of 94 slices shown]
[im 32/94  soft-tissue]
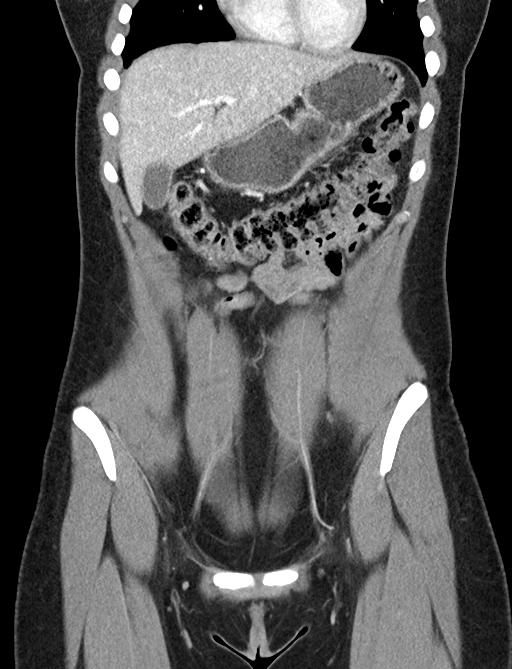
[im 42/94  soft-tissue]
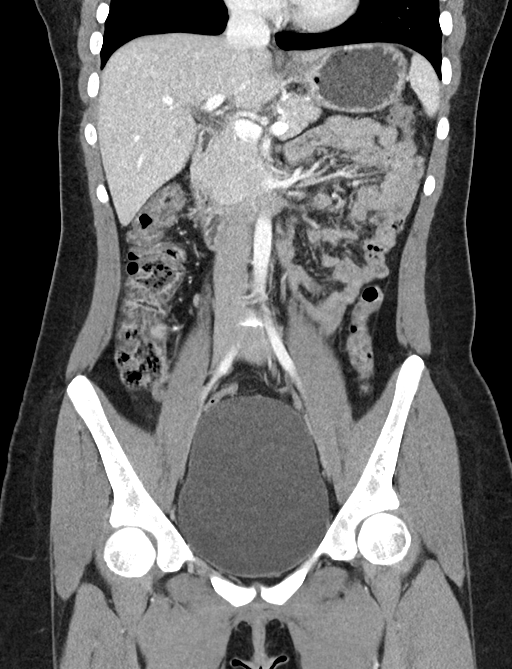
[im 52/94  soft-tissue]
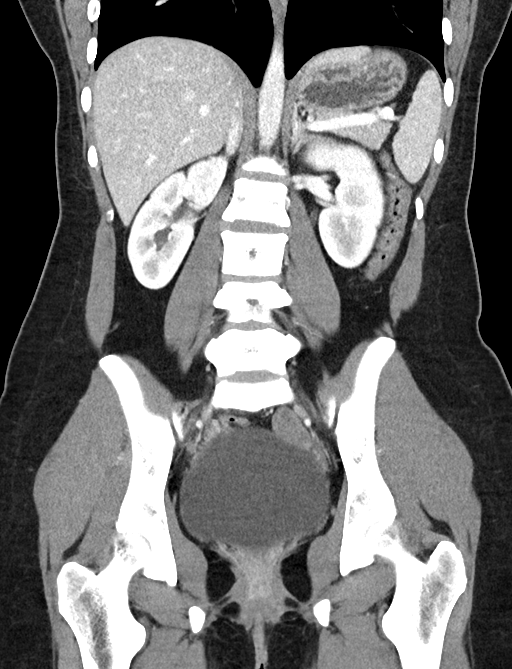

[16 of 46 positions shown; findings below may reference images not displayed]

FINDINGS: Lower chest: No acute abnormality.

Hepatobiliary: Mild fatty infiltration along the falciform.
Otherwise, the liver is unremarkable as is the gallbladder.

Pancreas: Normal

Spleen: Normal

Adrenals/Urinary Tract: Normal

Stomach/Bowel: Abnormal appearance of the appendix with
periappendiceal inflammatory change noted, series [DATE]

Appendix: Location: Right hemipelvis overlying the psoas.

Diameter: 8 mm

Appendicolith: None

Mucosal hyper-enhancement: Yes

Extraluminal gas: None

Periappendiceal collection: None

No other acute abnormality of the gastrointestinal system.

Vascular/Lymphatic: No significant vascular findings are present. No
enlarged abdominal or pelvic lymph nodes.

Reproductive: Small follicles noted of both ovaries. The uterus is
unremarkable.

Other: Tiny periumbilical fat containing hernia. No abdominopelvic
ascites.

Musculoskeletal: No acute or significant osseous findings.
IMPRESSION: Acute uncomplicated appendicitis as above.

## 2020-01-31 ENCOUNTER — Telehealth (INDEPENDENT_AMBULATORY_CARE_PROVIDER_SITE_OTHER): Payer: Self-pay | Admitting: Neurology

## 2020-01-31 NOTE — Telephone Encounter (Signed)
Per last note, no follow up is needed. Please advise regarding refill

## 2020-01-31 NOTE — Telephone Encounter (Signed)
°  Who's calling (name and relationship to patient) : Christina Morales (self)  Best contact number: [phone]  Provider they see: Dr. Devonne Doughty  Reason for call: Patient requests refill [place] - requests medication be sent to pharmacy below.    PRESCRIPTION REFILL ONLY  Name of prescription: levETIRAcetam (KEPPRA XR) 500 MG 24 hr tablet(Expired)  Pharmacy: [facility], [address] [place], [place], [place]

## 2020-02-01 ENCOUNTER — Telehealth: Payer: Self-pay | Admitting: Neurology

## 2020-02-01 ENCOUNTER — Other Ambulatory Visit (INDEPENDENT_AMBULATORY_CARE_PROVIDER_SITE_OTHER): Payer: Self-pay | Admitting: Neurology

## 2020-02-01 MED ORDER — LEVETIRACETAM ER 500 MG PO TB24: 1000.0000 mg | ORAL_TABLET | Freq: Every day | ORAL | 2 refills | Status: DC

## 2020-02-01 NOTE — Telephone Encounter (Signed)
  Who's calling (name and relationship to patient) :Self / Joyce Copa  Best contact number:[phone]  Provider they see:Dr. NAB   Reason for call:medication Refill      PRESCRIPTION REFILL ONLY  Name of prescription:Keppra   Pharmacy:[facility]/ [address] [place] . [place], [place] [address]

## 2020-02-01 NOTE — Telephone Encounter (Signed)
Patient is aware that the rx has been sent in for a few months. Patient confirmed she was just waiting on her insurance to set up a new neurologist in [place]

## 2020-02-01 NOTE — Telephone Encounter (Signed)
I sent the prescription to the pharmacy for couple of months. If she is not going to follow-up with adult neurology, please make an appointment for follow-up visit with myself over the next month or so.

## 2020-10-01 IMAGING — US US PELVIS COMPLETE
1 series · 14 of 25 positions shown · non-contrast
Comparison: Prior CT from earlier same day.

CLINICAL DATA: Initial evaluation for acute right-sided pelvic pain
for 1 day.

EXAM:
TRANSABDOMINAL ULTRASOUND OF PELVIS
DOPPLER ULTRASOUND OF OVARIES
TECHNIQUE: Transabdominal ultrasound examination of the pelvis was performed
including evaluation of the uterus, ovaries, adnexal regions, and
pelvic cul-de-sac.
Color and duplex Doppler ultrasound was utilized to evaluate blood
flow to the ovaries.

[Series 1: us pelvis complete · 0.28mm/px · 14 of 48 slices shown]
[im 1/48]
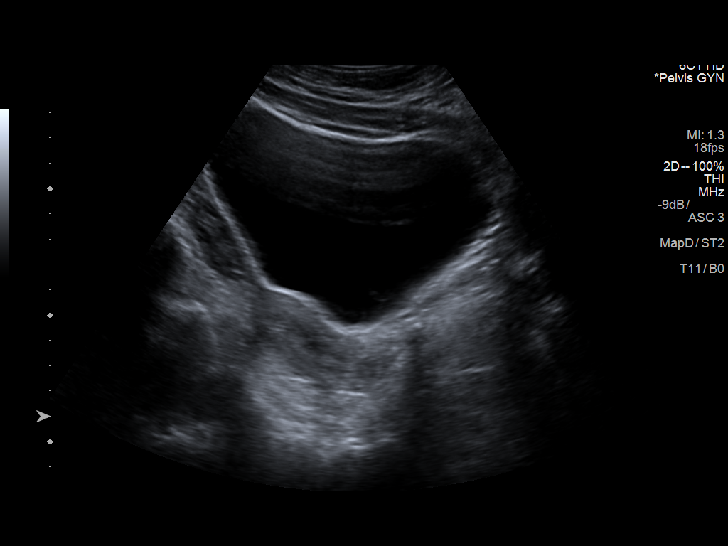
[im 4/48]
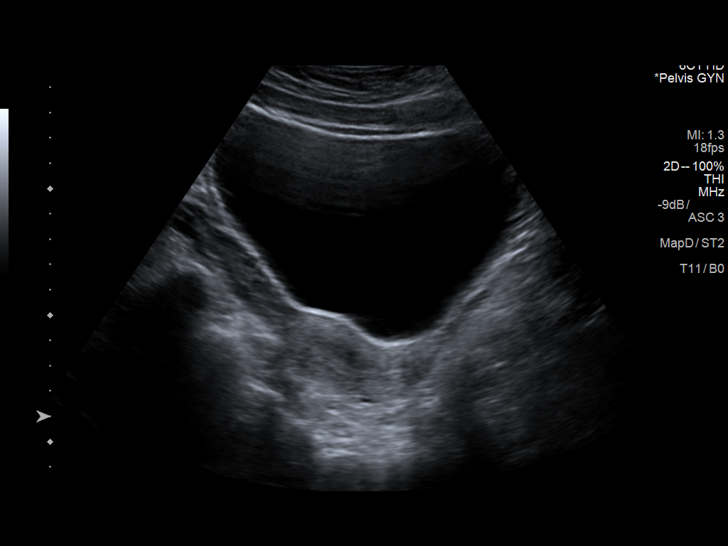
[im 8/48]
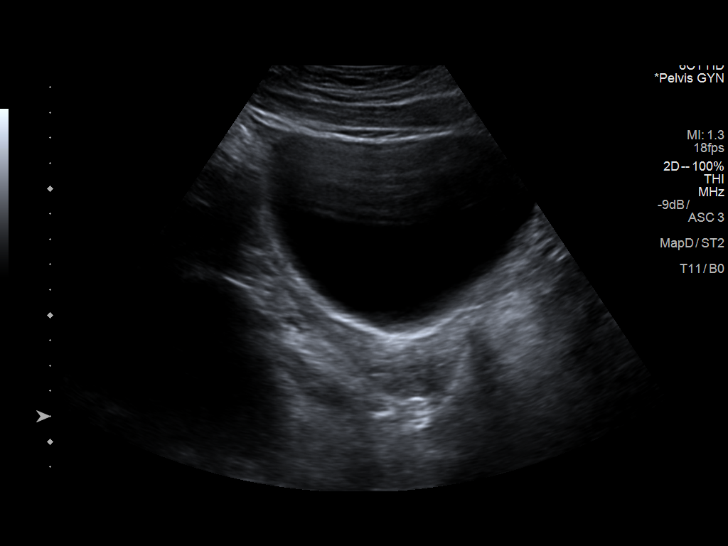
[im 12/48]
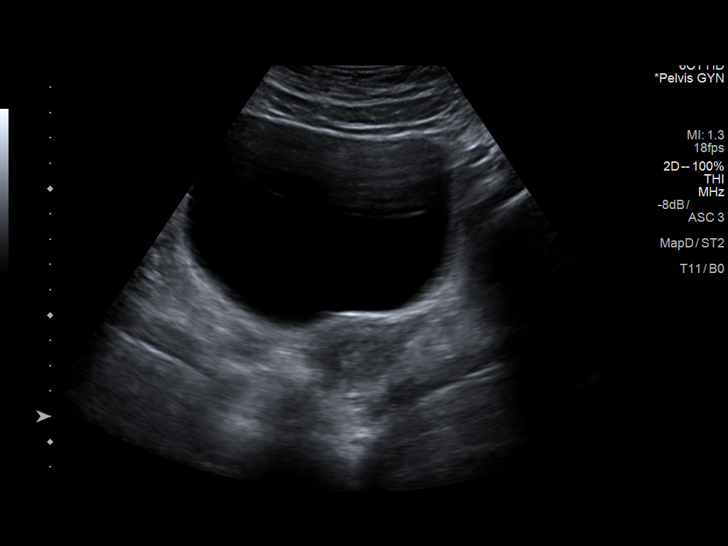
[im 16/48]
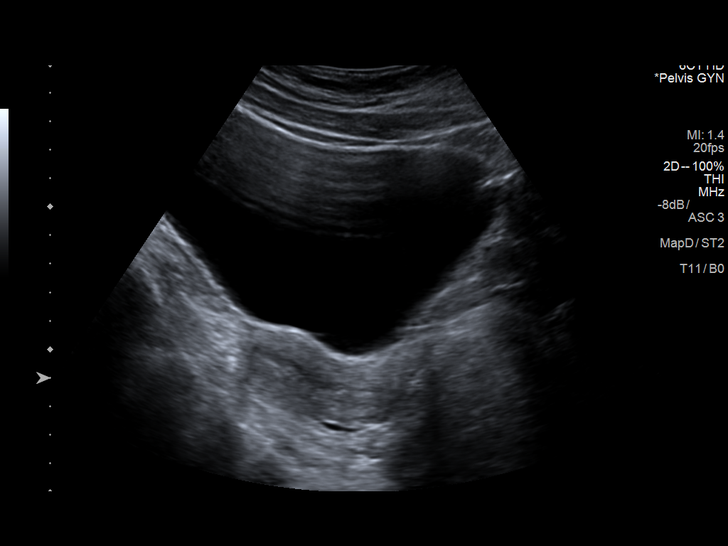
[im 18/48]
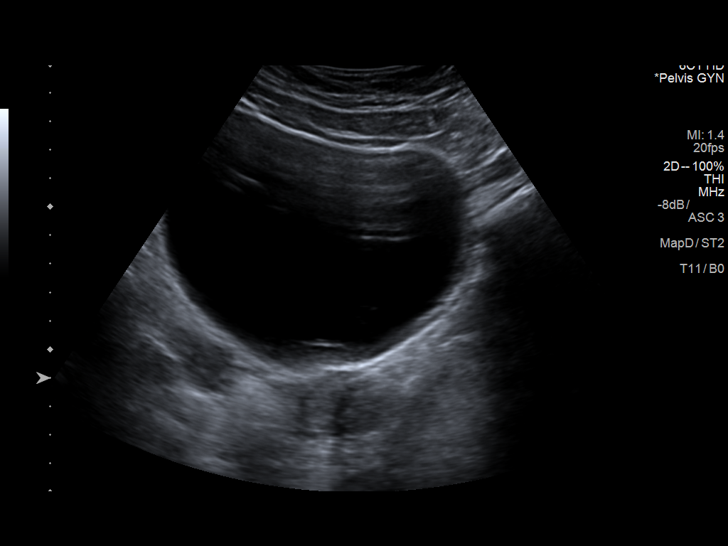
[im 22/48]
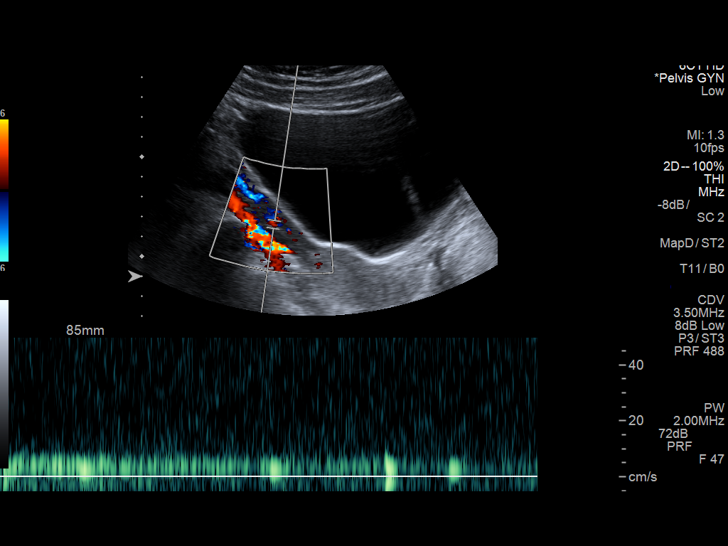
[im 26/48]
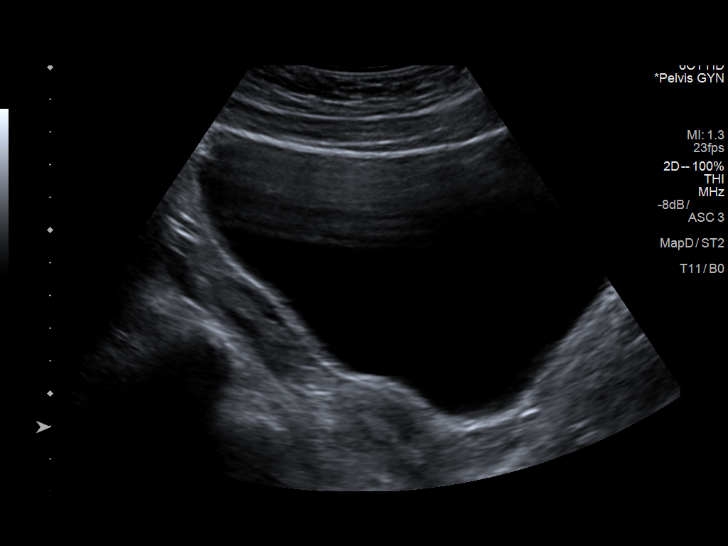
[im 30/48]
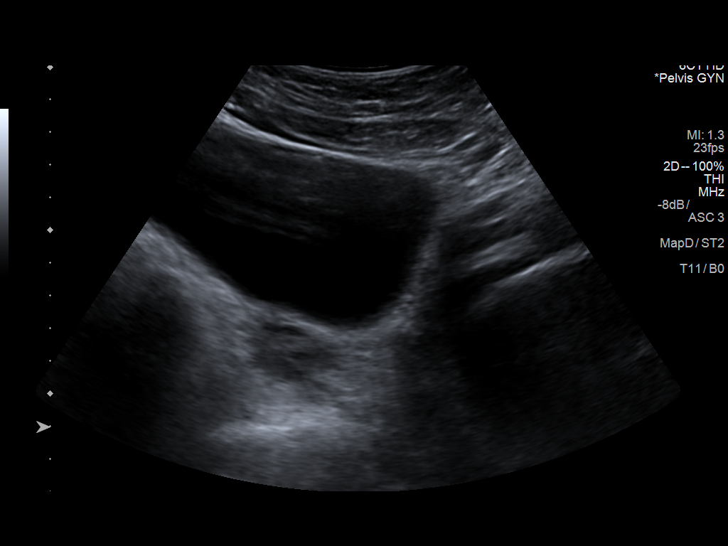
[im 32/48]
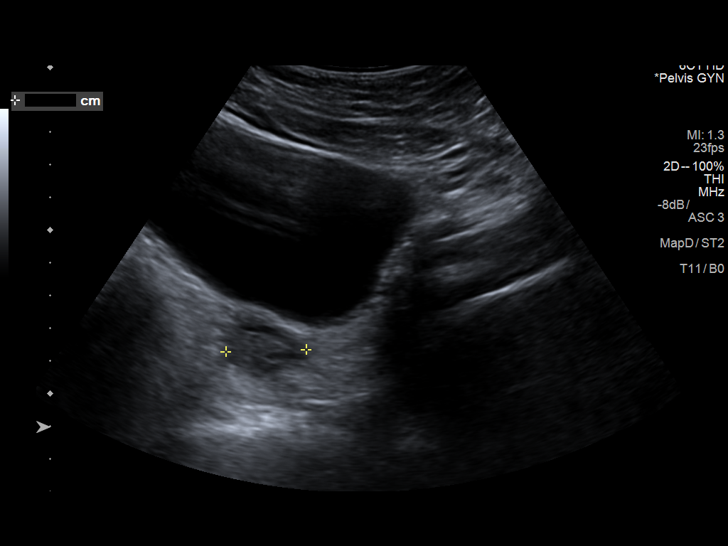
[im 36/48]
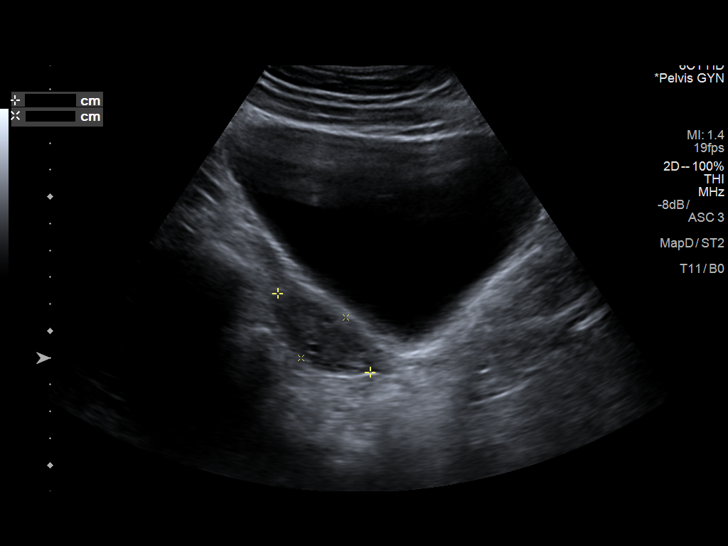
[im 40/48]
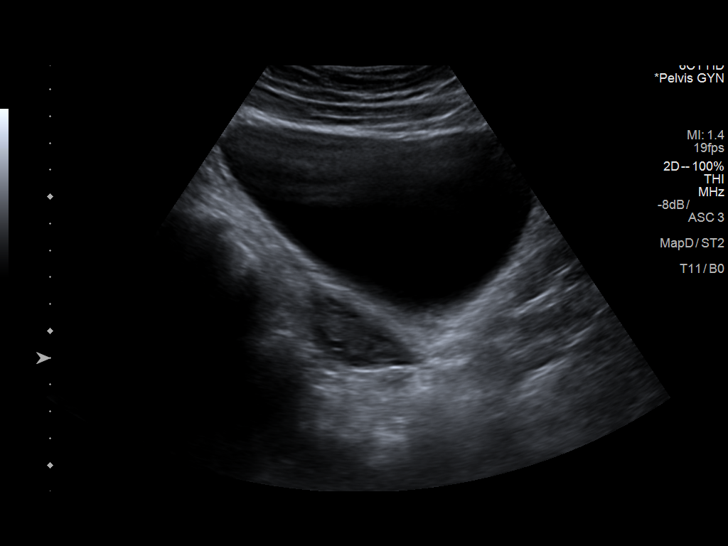
[im 44/48]
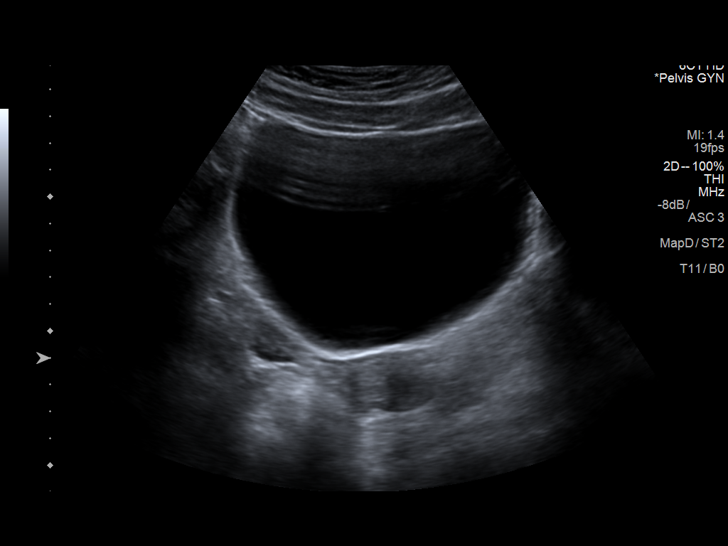
[im 48/48]
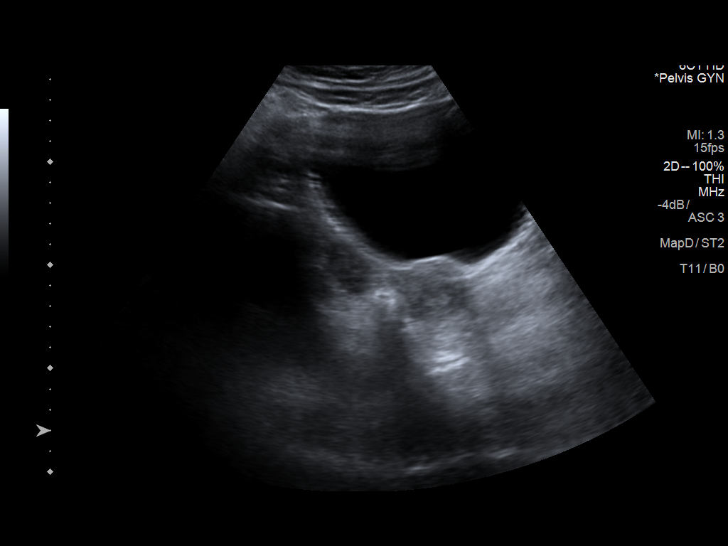

[14 of 25 positions shown; findings below may reference images not displayed]

FINDINGS: Uterus

Measurements: 5.5 x 2.5 x 4.1 cm. No fibroids or other mass
visualized.

Endometrium

Thickness: 5.5 mm.  No focal abnormality visualized.

Right ovary

Measurements: 4.5 x 2.3 x 2.0 cm. Normal appearance/no adnexal mass.

Left ovary

Measurements: 4.5 x 1.3 x 2.5 cm. Normal appearance/no adnexal mass.

Pulsed Doppler evaluation demonstrates normal low-resistance
arterial and venous waveforms in both ovaries.

Other: No free fluid within the pelvis.
IMPRESSION: Negative pelvic ultrasound. No evidence for torsion or other acute
abnormality.

## 2022-07-15 DIAGNOSIS — J069 Acute upper respiratory infection, unspecified: Secondary | ICD-10-CM | POA: Diagnosis not present

## 2022-07-15 DIAGNOSIS — R051 Acute cough: Secondary | ICD-10-CM | POA: Diagnosis not present

## 2022-07-15 DIAGNOSIS — J029 Acute pharyngitis, unspecified: Secondary | ICD-10-CM | POA: Diagnosis not present

## 2022-08-08 ENCOUNTER — Ambulatory Visit: Payer: BC Managed Care – PPO | Admitting: Family Medicine

## 2022-08-08 ENCOUNTER — Encounter: Payer: Self-pay | Admitting: Family Medicine

## 2022-08-08 VITALS — BP 106/72 | HR 88 | Temp 97.6°F | Ht 63.0 in | Wt 167.6 lb

## 2022-08-08 DIAGNOSIS — E559 Vitamin D deficiency, unspecified: Secondary | ICD-10-CM

## 2022-08-08 DIAGNOSIS — G40B09 Juvenile myoclonic epilepsy, not intractable, without status epilepticus: Secondary | ICD-10-CM | POA: Diagnosis not present

## 2022-08-08 DIAGNOSIS — Z0000 Encounter for general adult medical examination without abnormal findings: Secondary | ICD-10-CM

## 2022-08-08 DIAGNOSIS — Z23 Encounter for immunization: Secondary | ICD-10-CM

## 2022-08-08 LAB — CBC WITH DIFFERENTIAL/PLATELET
HCT: 40.5 % (ref 35.0–45.0)
Hemoglobin: 13.9 g/dL (ref 11.7–15.5)
Lymphs Abs: 2142 cells/uL (ref 850–3900)
MCV: 88.4 fL (ref 80.0–100.0)
Neutrophils Relative %: 72.3 %
Platelets: 335 10*3/uL (ref 140–400)
RBC: 4.58 10*6/uL (ref 3.80–5.10)
RDW: 12.1 % (ref 11.0–15.0)
WBC: 10.3 10*3/uL (ref 3.8–10.8)

## 2022-08-08 NOTE — Progress Notes (Unsigned)
Phone [phone]   Subjective:   Patient is a 22 y.o. female presenting for annual physical.    Chief Complaint  Patient presents with   New Patient (Initial Visit)    Pt wants physical and blood work today   New physical-not immune to hep b.  D was low in June Sz disorder.  Last sz 2019.  Keppra holding well-prefers females  See problem oriented charting- ROS- ROS: Gen: no fever, chills  Skin: no rash, itching ENT: no ear pain, ear drainage, nasal congestion, rhinorrhea, sinus pressure, sore throat Eyes: no blurry vision, double vision Resp: no cough, wheeze,SOB CV: no CP, palpitations, LE edema,  GI: no heartburn, n/v/d/c, abd pain GU: no dysuria, urgency, frequency, hematuria.  Menses reg since stopped ocp 02/2022.   MSK: no joint pain, myalgias, back pain Neuro: no dizziness,  weakness, vertigo   occ HA-can be bad and has to be in dark and sleep. Bad-qo mo.  Ibu helps some.  Psych: occ depression, no anxiety, insomnia, SI   The following were reviewed and entered/updated in epic: Past Medical History:  Diagnosis Date   Depression    Seizures ([place])    dx 2015-juvenile myoclonic epilepsy   Patient Active Problem List   Diagnosis Date Noted   Acute appendicitis 04/21/2018   Juvenile myoclonic epilepsy, not intractable, without status epilepticus ([place]) 04/28/2014   Juvenile myoclonic epilepsy ([place]) 10/06/2013   Past Surgical History:  Procedure Laterality Date   APPENDECTOMY     04/2018   LAPAROSCOPIC APPENDECTOMY N/A 04/21/2018   Procedure: APPENDECTOMY LAPAROSCOPIC;  Surgeon: Donnie Mesa, MD;  Location: [place];  Service: General;  Laterality: N/A;    Family History  Problem Relation Age of Onset   Depression Mother    Arthritis Father     Medications- reviewed and updated Current Outpatient Medications  Medication Sig Dispense Refill   levETIRAcetam (KEPPRA XR) 500 MG 24 hr tablet Take 2 tablets by mouth daily.     No current facility-administered  medications for this visit.    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    She enjoys theatre and video games.   Objective  Objective:  BP 106/72 (BP Location: Left Arm, Patient Position: Sitting)   Pulse 88   Temp 97.6 F (36.4 C) (Temporal)   Ht 5\' 3"  (1.6 m)   Wt 167 lb 9.6 oz (76 kg)   LMP 08/01/2022 (Exact Date)   SpO2 97%   BMI 29.69 kg/m  Physical Exam  Gen: WDWN NAD HEENT: NCAT, conjunctiva not injected, sclera nonicteric TM WNL B, OP moist, no exudates  NECK:  supple, no thyromegaly, no nodes, no carotid bruits CARDIAC: RRR, S1S2+, no murmur. DP 2+B LUNGS: CTAB. No wheezes ABDOMEN:  BS+, soft, NTND, No HSM, no masses EXT:  no edema MSK: no gross abnormalities. MS 5/5 all 4 NEURO: A&O x3.  CN II-XII intact.  PSYCH: normal mood. Good eye contact      Assessment and Plan   Health Maintenance counseling: 1. Anticipatory guidance: Patient counseled regarding regular dental exams q6 months, eye exams,  avoiding smoking and second hand smoke, limiting alcohol to 1 beverage per day, no illicit drugs.   2. Risk factor reduction:  Advised patient of need for regular exercise and  diet rich and fruits and vegetables to reduce risk of heart attack and stroke. Exercise- encouraged.  Wt Readings from Last 3 Encounters:  08/08/22 167 lb 9.6 oz (76 kg)  05/11/19 156 lb 12 oz (71.1 kg)  04/27/18 147 lb 7.8 oz (66.9 kg) (79 %, Z= 0.80)*   * Growth percentiles are based on CDC (Girls, 2-20 Years) data.   3. Immunizations/screenings/ancillary studies Immunization History  Administered Date(s) Administered   Janssen (J&J) SARS-COV-2 Vaccination 10/04/2019   Pfizer Covid-19 Vaccine Bivalent Booster 52yrs & up 03/05/2021   Tdap 12/24/2021   Health Maintenance Due   Topic Date Due   HPV VACCINES (1 - 2-dose series) Never done   HIV Screening  Never done   INFLUENZA VACCINE  Never done   COVID-19 Vaccine (3 - 2023-24 season) 03/07/2022    4. Cervical cancer screening: utd 5. Skin cancer screening- advised regular sunscreen use. Denies worrisome, changing, or new skin lesions.  6. Birth control/STD check: doesn't need 7. Smoking associated screening: non smoker 8. Alcohol screening: none  Problem List Items Addressed This Visit       Nervous and Auditory   Juvenile myoclonic epilepsy ([place])   Relevant Medications   levETIRAcetam (KEPPRA XR) 500 MG 24 hr tablet   Other Visit Diagnoses     Wellness examination    -  Primary   Relevant Orders   Comprehensive metabolic panel   Lipid panel   TSH   Hemoglobin A1c   CBC with Differential/Platelet   VITAMIN D 25 Hydroxy (Vit-D Deficiency, Fractures)   Vitamin D deficiency       Relevant Orders   VITAMIN D 25 Hydroxy (Vit-D Deficiency, Fractures)     Wellness-anticipatory guidance.  Work on [employer].  Check CBC,CMP,lipids,TSH, A1C.  F/u 1 yr  Vit D deficiency-check D Juvenile myoclonic epilepsy-chronic.  Controlled.  Cont meds.   Recommended follow up: annualReturn in about 1 year (around 08/09/2023) for annual. No future appointments.  Lab/Order associations:+ fasting   ICD-10-CM   1. Wellness examination  Z00.00 Comprehensive metabolic panel    Lipid panel    TSH    Hemoglobin A1c    CBC with Differential/Platelet    VITAMIN D 25 Hydroxy (Vit-D Deficiency, Fractures)    2. Vitamin D deficiency  E55.9 VITAMIN D 25 Hydroxy (Vit-D Deficiency, Fractures)    3. Nonintractable juvenile myoclonic epilepsy without status epilepticus ([place])  G40.B09       No orders of the defined types were placed in this encounter.    Wellington Hampshire, MD

## 2022-08-08 NOTE — Patient Instructions (Signed)
Welcome to [facility]! It was a pleasure meeting you today.  As discussed, Please schedule a 12 month follow up visit today.  Check on HPV vaccine  PLEASE NOTE:  If you had any LAB tests please let us know if you have not heard back within a few days. You may see your results on MyChart before we have a chance to review them but we will give you a call once they are reviewed by [place]. If we ordered any REFERRALS today, please let us know if you have not heard from their office within the next week.  Let us know through MyChart if you are needing REFILLS, or have your pharmacy send [place] the request. You can also use MyChart to communicate with me or any office staff.  Please try these tips to maintain a healthy lifestyle:  Eat most of your calories during the day when you are active. Eliminate processed foods including packaged sweets (pies, cakes, cookies), reduce intake of potatoes, white bread, white pasta, and white rice. Look for whole grain options, oat flour or almond flour.  Each meal should contain half fruits/vegetables, one quarter protein, and one quarter carbs (no bigger than a computer mouse).  Cut down on sweet beverages. This includes juice, soda, and sweet tea. Also watch fruit intake, though this is a healthier sweet option, it still contains natural sugar! Limit to 3 servings daily.  Drink at least 1 glass of water with each meal and aim for at least 8 glasses per day  Exercise at least 150 minutes every week.

## 2022-08-09 LAB — CBC WITH DIFFERENTIAL/PLATELET
Absolute Monocytes: 597 cells/uL (ref 200–950)
Basophils Absolute: 31 cells/uL (ref 0–200)
Basophils Relative: 0.3 %
Eosinophils Absolute: 82 cells/uL (ref 15–500)
Eosinophils Relative: 0.8 %
MCH: 30.3 pg (ref 27.0–33.0)
MCHC: 34.3 g/dL (ref 32.0–36.0)
MPV: 9.3 fL (ref 7.5–12.5)
Monocytes Relative: 5.8 %
Neutro Abs: 7447 cells/uL (ref 1500–7800)
Total Lymphocyte: 20.8 %

## 2022-08-09 LAB — LIPID PANEL
Cholesterol: 164 mg/dL (ref <200)
HDL: 58 mg/dL (ref >=50)
LDL Cholesterol (Calc): 92 mg/dL (calc)
Non-HDL Cholesterol (Calc): 106 mg/dL (calc) (ref <130)
Total CHOL/HDL Ratio: 2.8 (calc) (ref <5.0)
Triglycerides: 53 mg/dL (ref <150)

## 2022-08-09 LAB — COMPREHENSIVE METABOLIC PANEL
AG Ratio: 1.3 (calc) (ref 1.0–2.5)
ALT: 15 U/L (ref 6–29)
AST: 15 U/L (ref 10–30)
Albumin: 4.4 g/dL (ref 3.6–5.1)
Alkaline phosphatase (APISO): 115 U/L (ref 31–125)
BUN: 8 mg/dL (ref 7–25)
CO2: 25 mmol/L (ref 20–32)
Calcium: 9.7 mg/dL (ref 8.6–10.2)
Chloride: 104 mmol/L (ref 98–110)
Creat: 0.61 mg/dL (ref 0.50–0.96)
Globulin: 3.3 g/dL (calc) (ref 1.9–3.7)
Glucose, Bld: 96 mg/dL (ref 65–99)
Potassium: 3.9 mmol/L (ref 3.5–5.3)
Sodium: 142 mmol/L (ref 135–146)
Total Bilirubin: 0.3 mg/dL (ref 0.2–1.2)
Total Protein: 7.7 g/dL (ref 6.1–8.1)

## 2022-08-09 LAB — HEMOGLOBIN A1C
Hgb A1c MFr Bld: 5.1 % of total Hgb (ref <5.7)
Mean Plasma Glucose: 100 mg/dL
eAG (mmol/L): 5.5 mmol/L

## 2022-08-09 LAB — VITAMIN D 25 HYDROXY (VIT D DEFICIENCY, FRACTURES): Vit D, 25-Hydroxy: 23 ng/mL — ABNORMAL LOW (ref 30–100)

## 2022-08-09 LAB — TSH: TSH: 0.96 mIU/L

## 2022-08-10 NOTE — Progress Notes (Signed)
Labs are great except vitamin d is a little low-take 2000 iu/d OTC

## 2022-09-17 ENCOUNTER — Encounter: Payer: Self-pay | Admitting: Family Medicine

## 2022-09-17 ENCOUNTER — Encounter: Payer: Self-pay | Admitting: Neurology

## 2022-09-17 ENCOUNTER — Other Ambulatory Visit: Payer: Self-pay | Admitting: *Deleted

## 2022-09-17 DIAGNOSIS — G40B09 Juvenile myoclonic epilepsy, not intractable, without status epilepticus: Secondary | ICD-10-CM

## 2022-09-19 ENCOUNTER — Other Ambulatory Visit: Payer: Self-pay | Admitting: *Deleted

## 2022-09-19 MED ORDER — LEVETIRACETAM ER 500 MG PO TB24: 1000.0000 mg | ORAL_TABLET | Freq: Every day | ORAL | 0 refills | Status: DC

## 2022-10-24 ENCOUNTER — Encounter: Payer: Self-pay | Admitting: Neurology

## 2022-10-24 ENCOUNTER — Ambulatory Visit: Payer: BC Managed Care – PPO | Admitting: Neurology

## 2022-10-24 VITALS — BP 105/79 | HR 64 | Ht 63.5 in | Wt 172.4 lb

## 2022-10-24 DIAGNOSIS — G40B09 Juvenile myoclonic epilepsy, not intractable, without status epilepticus: Secondary | ICD-10-CM

## 2022-10-24 MED ORDER — LEVETIRACETAM ER 750 MG PO TB24: ORAL_TABLET | ORAL | 3 refills | Status: DC

## 2022-10-24 NOTE — Patient Instructions (Signed)
Good to meet you!   We can try reducing Keppra XR (Levetiracetam ER) to : take 1 tablet every night. Let me know if any issues  2. Follow-up in 6 months, call for any changes   Seizure Precautions: 1. If medication has been prescribed for you to prevent seizures, take it exactly as directed.  Do not stop taking the medicine without talking to your doctor first, even if you have not had a seizure in a long time.   2. Avoid activities in which a seizure would cause danger to yourself or to others.  Don't operate dangerous machinery, swim alone, or climb in high or dangerous places, such as on ladders, roofs, or girders.  Do not drive unless your doctor says you may.  3. If you have any warning that you may have a seizure, lay down in a safe place where you can't hurt yourself.    4.  No driving for 6 months from last seizure, as per [facility].   Please refer to the following link on the Epilepsy Foundation of America's website for more information: http://www.epilepsyfoundation.org/answerplace/Social/driving/drivingu.cfm   5.  Maintain good sleep hygiene. Avoid alcohol.  6.  Notify your neurology if you are planning pregnancy or if you become pregnant.  7.  Contact your doctor if you have any problems that may be related to the medicine you are taking.  8.  Call 911 and bring the patient back to the ED if:        A.  The seizure lasts longer than 5 minutes.       B.  The patient doesn't awaken shortly after the seizure  C.  The patient has new problems such as difficulty seeing, speaking or moving  D.  The patient was injured during the seizure  E.  The patient has a temperature over 102 F (39C)  F.  The patient vomited and now is having trouble breathing

## 2022-10-24 NOTE — Progress Notes (Signed)
NEUROLOGY CONSULTATION NOTE  Christina Morales MRN: [id number] DOB: 03/27/2001  Referring provider: Dr. Jeani Sow Primary care provider: Dr. Jeani Sow  Reason for consult:  establish care for seizures  Dear Dr Ruthine Dose:  Thank you for your kind referral of Christina Morales for consultation of the above symptoms. Although her history is well known to you, please allow me to reiterate it for the purpose of our medical record. She is alone in the office today. Records and images were personally reviewed where available.   HISTORY OF PRESENT ILLNESS: This is a pleasant 22 year old left-handed woman presenting to establish care for Juvenile Myoclonic Epilepsy. The first seizure occurred at age 51, she recalls getting ready for school, then she jerked pretty badly then lost consciousness. Her mother found her with stiffening, shaking, subtle rhythmic jerking movements and drooling. She reported frequent morning myoclonic jerks for 2 years. EEG in 09/2013 was "significantly abnormal due to frequent episodes of photoparoxysmal response with generalized spikes and slow wave activity with the frequency of 4 hertz and duration of 1-4 seconds." She was started on Levetiracetam. She had another seizure on 04/2014 again preceded by a myoclonic jerk. She did well for 4 years on Levetiracetam  BID and was switched to extended-release in 2018. EEG done 2019 was normal and medication taper was attempted, however on 04/2018 she had an unwitnessed episode of loss of consciousness in the shower. Levetiracetam ER  daily was restarted with no further seizures. She reports stress may also be a trigger. She has not had any myoclonic jerks since starting LEV. She denies any staring episodes, gaps in time, olfactory/gustatory hallucinations, deja vu, rising epigastric sensation, focal numbness/tingling/weakness. No side effects on medication.  She denies any headaches except recently which she attributes to  pollen. No dizziness, vision changes, neck/back pain, bowel/bladder dysfunction. Memory is okay. Mood is good. She usually gets 7 hours of sleep. She lives with her partner and partner's family. No pregnancy plans any time soon. She works as Editor, commissioning for a Chemical engineer. She is driving. She drinks socially.   Epilepsy Risk Factors:  She had a normal birth and early development.  There is no history of febrile convulsions, CNS infections such as meningitis/encephalitis, significant traumatic brain injury, neurosurgical procedures, or family history of seizures.  Prior AEDs: immediate-release LEV  Diagnostic Data: EEGs: 09/2013 significantly abnormal due to frequent episodes of photoparoxysmal response with generalized spikes and slow wave activity with the frequency of 4 hertz and duration of 1-4 seconds."  12/2013:2 brief episodes of photoparoxysmal response. This is a significant improvement compared to her previous EEG in March of 2015 which revealed frequent photoparoxysmal response.  08/2015:brief photoparoxysmal response with photic stimulations  01/2018: normal 04/2018:  sporadic spikes and sharps in the frontocentral area, mostly in the right side and mostly during hyperventilation.   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 60 tablet 0   No current  facility-administered medications on file prior to visit.    ALLERGIES: Allergies  Allergen Reactions   Dairy Aid [Tilactase] Nausea And Vomiting    FAMILY HISTORY: Family History  Problem Relation Age of Onset   Depression Mother    Arthritis Father     SOCIAL HISTORY: Social History   Socioeconomic History   Marital status: Single    Spouse name: Not on file   Number of children: 0   Years of education: Not on file   Highest education level: Not on file  Occupational History   Not on file  Tobacco Use   Smoking status: Never   Smokeless tobacco: Never  Vaping Use   Vaping Use: Never used  Substance and Sexual Activity   Alcohol use: Yes    Comment: Only drink on special occasions   Drug use: No   Sexual activity: Yes    Birth control/protection: None  Other Topics Concern   Not on file  Social History Narrative   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    She enjoys theatre and video games.   Left handed    Social Determinants of Health   Financial Resource Strain: Not on file  Food Insecurity: Not on file  Transportation Needs: Not on file  Physical Activity: Not on file  Stress: Not on file  Social Connections: Not on file  Intimate Partner Violence: Not on file     PHYSICAL EXAM: Vitals:   10/24/22 0846  BP: 105/79  Pulse: 64  SpO2: 99%   General: No acute distress Head:  Normocephalic/atraumatic Skin/Extremities: No rash, no edema Neurological Exam: Mental status: alert and oriented to person, place, and time, no dysarthria or aphasia, Fund of knowledge is appropriate.  Recent and remote memory are intact, 3/3 delayed recall.  Attention and concentration are normal, 5/5 WORLD backwards. Cranial nerves: CN I: not tested CN II: pupils equal, round, visual fields intact CN III, IV, VI:  full range of motion, no nystagmus, no ptosis CN V: facial sensation intact CN VII: upper and  lower face symmetric CN VIII: hearing intact to conversation Bulk & Tone: normal, no fasciculations. Motor: 5/5 throughout with no pronator drift. Sensation: intact to light touch, cold, pin, vibration sense.  No extinction to double simultaneous stimulation.  Romberg test negative Deep Tendon Reflexes: +2 throughout Cerebellar: no incoordination on finger to nose testing Gait: narrow-based and steady, able to tandem walk adequately. Tremor: none   IMPRESSION: This is a pleasant 22 year old left-handed woman with a history of Juvenile Myoclonic Epilepsy. She went 4 years seizure-free until another seizure when medication was weaned in 2019. No seizures since 04/2018. We discussed the diagnosis and prognosis of JME, usually requiring lifelong treatment with seizure medication. She would like to try reducing dose if possible, stating she is in a better place with stress levels compared to previous. We agreed to reduce Levetiracetam ER to  qhs, she knows to call our office for any changes. Avoidance of seizure triggers discussed. No pregnancy plans. [place] driving laws were discussed with the patient, and she knows to stop driving after a seizure, until 6 months seizure-free. Follow-up in 6 months, call for any changes.   Thank you for allowing me to participate in the care of this patient. Please do not hesitate  to call for any questions or concerns.   Patrcia Dolly, M.D.  CC: Dr. Ruthine Dose

## 2022-11-04 ENCOUNTER — Encounter: Payer: Self-pay | Admitting: Neurology

## 2022-11-07 ENCOUNTER — Ambulatory Visit: Payer: BC Managed Care – PPO | Admitting: Physician Assistant

## 2022-11-07 ENCOUNTER — Encounter: Payer: Self-pay | Admitting: Physician Assistant

## 2022-11-07 VITALS — BP 110/74 | HR 82 | Temp 97.7°F | Ht 63.5 in | Wt 168.4 lb

## 2022-11-07 DIAGNOSIS — R1013 Epigastric pain: Secondary | ICD-10-CM | POA: Diagnosis not present

## 2022-11-07 LAB — CBC WITH DIFFERENTIAL/PLATELET
Basophils Absolute: 0 10*3/uL (ref 0.0–0.1)
Basophils Relative: 0.4 % (ref 0.0–3.0)
Eosinophils Absolute: 0 10*3/uL (ref 0.0–0.7)
Eosinophils Relative: 0.8 % (ref 0.0–5.0)
HCT: 41.5 % (ref 36.0–46.0)
Hemoglobin: 14.1 g/dL (ref 12.0–15.0)
Lymphocytes Relative: 26.4 % (ref 12.0–46.0)
Lymphs Abs: 1.5 10*3/uL (ref 0.7–4.0)
MCHC: 34 g/dL (ref 30.0–36.0)
MCV: 89 fl (ref 78.0–100.0)
Monocytes Absolute: 0.4 10*3/uL (ref 0.1–1.0)
Monocytes Relative: 6.6 % (ref 3.0–12.0)
Neutro Abs: 3.6 10*3/uL (ref 1.4–7.7)
Neutrophils Relative %: 65.8 % (ref 43.0–77.0)
Platelets: 311 10*3/uL (ref 150.0–400.0)
RBC: 4.66 Mil/uL (ref 3.87–5.11)
RDW: 12.8 % (ref 11.5–15.5)
WBC: 5.5 10*3/uL (ref 4.0–10.5)

## 2022-11-07 LAB — COMPREHENSIVE METABOLIC PANEL
ALT: 19 U/L (ref 0–35)
AST: 17 U/L (ref 0–37)
Albumin: 4.3 g/dL (ref 3.5–5.2)
Alkaline Phosphatase: 115 U/L (ref 39–117)
BUN: 8 mg/dL (ref 6–23)
CO2: 26 mEq/L (ref 19–32)
Calcium: 9.4 mg/dL (ref 8.4–10.5)
Chloride: 102 mEq/L (ref 96–112)
Creatinine, Ser: 0.68 mg/dL (ref 0.40–1.20)
GFR: 122.37 mL/min (ref >60.00)
Glucose, Bld: 103 mg/dL — ABNORMAL HIGH (ref 70–99)
Potassium: 4 mEq/L (ref 3.5–5.1)
Sodium: 137 mEq/L (ref 135–145)
Total Bilirubin: 0.5 mg/dL (ref 0.2–1.2)
Total Protein: 7.6 g/dL (ref 6.0–8.3)

## 2022-11-07 LAB — HCG, QUANTITATIVE, PREGNANCY: Quantitative HCG: <0.6 m[IU]/mL

## 2022-11-07 LAB — H. PYLORI ANTIBODY, IGG: H Pylori IgG: NEGATIVE

## 2022-11-07 LAB — LIPASE: Lipase: 13 U/L (ref 11.0–59.0)

## 2022-11-07 MED ORDER — SUCRALFATE 1 G PO TABS: 1.0000 g | ORAL_TABLET | Freq: Three times a day (TID) | ORAL | 0 refills | Status: DC

## 2022-11-07 MED ORDER — PANTOPRAZOLE SODIUM 40 MG PO TBEC: 40.0000 mg | DELAYED_RELEASE_TABLET | Freq: Every day | ORAL | 0 refills | Status: DC

## 2022-11-07 NOTE — Progress Notes (Signed)
Christina Morales is a 22 y.o. female here for a new problem.  History of Present Illness:   Chief Complaint  Patient presents with   Abdominal Pain    Pt c/o epigastric pain and burning, started yesterday and had vomiting from diary products.    HPI  Abdominal pain Patient is complaining of abdominal pain with a burning sensation starting this morning. She expresses that she is lactose intolerant and was accidentally given dairy at dinner. Patient began throwing up hours after ingesting food. She hasn't had a bowel movement in the last couple of days, but this is normal for her. Patient managed pain with 2 ibuprofens. She denies excessive ibuprofen use, recent travel, chance of pregnancy, change in urination, pain with urination, frequency, upper respiratory illness, history of heartburn, and recent alcohol intake.   Past Medical History:  Diagnosis Date   Depression    Seizures (HCC)    dx 2015-juvenile myoclonic epilepsy     Social History   Tobacco Use   Smoking status: Never   Smokeless tobacco: Never  Vaping Use   Vaping Use: Never used  Substance Use Topics   Alcohol use: Yes    Comment: Only drink on special occasions   Drug use: No    Past Surgical History:  Procedure Laterality Date   APPENDECTOMY     04/2018   LAPAROSCOPIC APPENDECTOMY N/A 04/21/2018   Procedure: APPENDECTOMY LAPAROSCOPIC;  Surgeon: Manus Rudd, MD;  Location: [facility] OR;  Service: General;  Laterality: N/A;    Family History  Problem Relation Age of Onset   Depression Mother    Arthritis Father     Allergies  Allergen Reactions   Dairy Aid [Tilactase] Nausea And Vomiting    Current Medications:   Current Outpatient Medications:    levETIRAcetam (KEPPRA XR) 750 MG 24 hr tablet, Take 1 tablet every night, Disp: 90 tablet, Rfl: 3   pantoprazole (PROTONIX) 40 MG tablet, Take 1 tablet (40 mg total) by mouth daily., Disp: 30 tablet, Rfl: 0   sucralfate (CARAFATE) 1 g tablet, Take 1 tablet  (1 g total) by mouth 4 (four) times daily -  with meals and at bedtime for 7 days., Disp: 28 tablet, Rfl: 0   Review of Systems:   Review of Systems  Gastrointestinal:  Positive for abdominal pain.    Vitals:   Vitals:   11/07/22 1113  BP: 110/74  Pulse: 82  Temp: 97.7 F (36.5 C)  TempSrc: Temporal  SpO2: 97%  Weight: 168 lb 6.1 oz (76.4 kg)  Height: 5' 3.5" (1.613 m)     Body mass index is 29.36 kg/m.  Physical Exam:   Physical Exam Vitals and nursing note reviewed.  Constitutional:      General: She is not in acute distress.    Appearance: Normal appearance. She is well-developed. She is not ill-appearing or toxic-appearing.  HENT:     Head: Normocephalic and atraumatic.     Right Ear: External ear normal.     Left Ear: External ear normal.  Eyes:     Extraocular Movements: Extraocular movements intact.     Pupils: Pupils are equal, round, and reactive to light.  Cardiovascular:     Rate and Rhythm: Normal rate and regular rhythm.     Pulses: Normal pulses.     Heart sounds: Normal heart sounds, S1 normal and S2 normal. No murmur heard.    No gallop.  Pulmonary:     Effort: Pulmonary effort is normal. No  respiratory distress.     Breath sounds: Normal breath sounds. No wheezing or rales.  Abdominal:     General: Abdomen is flat. Bowel sounds are normal.     Palpations: Abdomen is soft.     Tenderness: There is abdominal tenderness in the epigastric area. There is no right CVA tenderness, left CVA tenderness, guarding or rebound. Negative signs include Murphy's sign.  Skin:    General: Skin is warm and dry.  Neurological:     Mental Status: She is alert and oriented to person, place, and time.     GCS: GCS eye subscore is 4. GCS verbal subscore is 5. GCS motor subscore is 6.  Psychiatric:        Speech: Speech normal.        Behavior: Behavior normal. Behavior is cooperative.        Judgment: Judgment normal.     Assessment and Plan:   Epigastric  pain No red flags No evidence of acute abdomen Suspect gastritis Update blood work to rule out h pylori, evaluate lipase, liver/kidney function HPT negative per patient  Advised as follows:  Start protonix daily x 2 weeks to see if this can calm down your stomach irritation  You can start this first and if little to no relief, add in 4 times a day carafate to help heal stomach irritation  Follow-up if new/worsening to consider imaging vs gastroenterology referral  I,Verona Buck,acting as a scribe for [employer], PA.,have documented all relevant documentation on the behalf of Jarold Motto, PA,as directed by  Jarold Motto, PA while in the presence of Jarold Motto, [place].  I, Jarold Motto, [place], have reviewed all documentation for this visit. The documentation on 11/07/22 for the exam, diagnosis, procedures, and orders are all accurate and complete.  Jarold Motto, PA-C

## 2022-11-07 NOTE — Patient Instructions (Signed)
It was great to see you!  Start protonix daily x 2 weeks to see if this can calm down your stomach irritation  You can start this first and if little to no relief, add in 4 times a day carafate to help heal stomach irritation  I will be in touch with blood work  If any new/worsening symptoms, let us know   Abdominal Pain, Adult Many things can cause belly (abdominal) pain. Most times, belly pain is not dangerous. Many cases of belly pain can be watched and treated at home. Sometimes, though, belly pain is serious. Your doctor will try to find the cause of your belly pain. Follow these instructions at home:  Medicines Take over-the-counter and prescription medicines only as told by your doctor. Do not take medicines that help you poop (laxatives) unless told by your doctor. General instructions Watch your belly pain for any changes. Drink enough fluid to keep your pee (urine) pale yellow. Keep all follow-up visits as told by your doctor. This is important. Contact a doctor if: Your belly pain changes or gets worse. You are not hungry, or you lose weight without trying. You are having trouble pooping (constipated) or have watery poop (diarrhea) for more than 2-3 days. You have pain when you pee or poop. Your belly pain wakes you up at night. Your pain gets worse with meals, after eating, or with certain foods. You are vomiting and cannot keep anything down. You have a fever. You have blood in your pee. Get help right away if: Your pain does not go away as soon as your doctor says it should. You cannot stop vomiting. Your pain is only in areas of your belly, such as the right side or the left lower part of the belly. You have bloody or black poop, or poop that looks like tar. You have very bad pain, cramping, or bloating in your belly. You have signs of not having enough fluid or water in your body (dehydration), such as: Dark pee, very little pee, or no pee. Cracked lips. Dry  mouth. Sunken eyes. Sleepiness. Weakness. You have trouble breathing or chest pain. Summary Many cases of belly pain can be watched and treated at home. Watch your belly pain for any changes. Take over-the-counter and prescription medicines only as told by your doctor. Contact a doctor if your belly pain changes or gets worse. Get help right away if you have very bad pain, cramping, or bloating in your belly. This information is not intended to replace advice given to you by your health care provider. Make sure you discuss any questions you have with your health care provider. Document Revised: 11/01/2018 Document Reviewed: 11/01/2018 Elsevier Patient Education  2023 [employer].  Take care,  Jarold Motto PA-C

## 2022-12-04 DIAGNOSIS — Z0279 Encounter for issue of other medical certificate: Secondary | ICD-10-CM

## 2022-12-10 ENCOUNTER — Telehealth: Payer: Self-pay | Admitting: Neurology

## 2022-12-10 NOTE — Telephone Encounter (Signed)
Pt called in stating the DMV still has not gotten her paperwork. She would like to request it be sent in again. She also has a second fax number of [phone]. She would like it faxed to both numbers.

## 2022-12-10 NOTE — Telephone Encounter (Signed)
Paperwork faxed 12/09/22 will refax it for pt

## 2022-12-22 DIAGNOSIS — Z6830 Body mass index (BMI) 30.0-30.9, adult: Secondary | ICD-10-CM | POA: Diagnosis not present

## 2022-12-22 DIAGNOSIS — Z1389 Encounter for screening for other disorder: Secondary | ICD-10-CM | POA: Diagnosis not present

## 2022-12-22 DIAGNOSIS — Z01419 Encounter for gynecological examination (general) (routine) without abnormal findings: Secondary | ICD-10-CM | POA: Diagnosis not present

## 2022-12-22 DIAGNOSIS — Z113 Encounter for screening for infections with a predominantly sexual mode of transmission: Secondary | ICD-10-CM | POA: Diagnosis not present

## 2022-12-22 DIAGNOSIS — Z130 Encounter for screening for diseases of the blood and blood-forming organs and certain disorders involving the immune mechanism: Secondary | ICD-10-CM | POA: Diagnosis not present

## 2022-12-22 DIAGNOSIS — N925 Other specified irregular menstruation: Secondary | ICD-10-CM | POA: Diagnosis not present

## 2022-12-31 ENCOUNTER — Other Ambulatory Visit: Payer: Self-pay

## 2022-12-31 ENCOUNTER — Encounter (HOSPITAL_COMMUNITY): Payer: Self-pay | Admitting: Emergency Medicine

## 2022-12-31 ENCOUNTER — Emergency Department (HOSPITAL_COMMUNITY)
Admission: EM | Admit: 2022-12-31 | Discharge: 2022-12-31 | Disposition: A | Discharge status: Home / Self Care | Payer: BC Managed Care – PPO | Attending: Emergency Medicine | Admitting: Emergency Medicine

## 2022-12-31 ENCOUNTER — Emergency Department (HOSPITAL_COMMUNITY): Payer: BC Managed Care – PPO

## 2022-12-31 DIAGNOSIS — R519 Headache, unspecified: Secondary | ICD-10-CM | POA: Insufficient documentation

## 2022-12-31 LAB — CBC
HCT: 41.2 % (ref 36.0–46.0)
Hemoglobin: 13.7 g/dL (ref 12.0–15.0)
MCH: 29.8 pg (ref 26.0–34.0)
MCHC: 33.3 g/dL (ref 30.0–36.0)
MCV: 89.6 fL (ref 80.0–100.0)
Platelets: 284 10*3/uL (ref 150–400)
RBC: 4.6 MIL/uL (ref 3.87–5.11)
RDW: 11.9 % (ref 11.5–15.5)
WBC: 8.1 10*3/uL (ref 4.0–10.5)
nRBC: 0 % (ref 0.0–0.2)

## 2022-12-31 LAB — BASIC METABOLIC PANEL
Anion gap: 9 (ref 5–15)
BUN: 7 mg/dL (ref 6–20)
CO2: 24 mmol/L (ref 22–32)
Calcium: 9.1 mg/dL (ref 8.9–10.3)
Chloride: 104 mmol/L (ref 98–111)
Creatinine, Ser: 0.67 mg/dL (ref 0.44–1.00)
GFR, Estimated: >60 mL/min (ref >60)
Glucose, Bld: 97 mg/dL (ref 70–99)
Potassium: 3.7 mmol/L (ref 3.5–5.1)
Sodium: 137 mmol/L (ref 135–145)

## 2022-12-31 LAB — HCG, QUANTITATIVE, PREGNANCY: hCG, Beta Chain, Quant, S: <1 m[IU]/mL (ref <5)

## 2022-12-31 MED ORDER — KETOROLAC TROMETHAMINE 15 MG/ML IJ SOLN
15.0000 mg | Freq: Once | INTRAMUSCULAR | Status: AC
  Administered 2022-12-31: 15 mg via INTRAVENOUS
  Filled 2022-12-31: qty 1

## 2022-12-31 MED ORDER — DEXAMETHASONE SODIUM PHOSPHATE 10 MG/ML IJ SOLN
10.0000 mg | Freq: Once | INTRAMUSCULAR | Status: AC
  Administered 2022-12-31: 10 mg via INTRAVENOUS
  Filled 2022-12-31: qty 1

## 2022-12-31 MED ORDER — PROCHLORPERAZINE EDISYLATE 10 MG/2ML IJ SOLN
10.0000 mg | Freq: Once | INTRAMUSCULAR | Status: AC
  Administered 2022-12-31: 10 mg via INTRAVENOUS
  Filled 2022-12-31: qty 2

## 2022-12-31 MED ORDER — DIPHENHYDRAMINE HCL 50 MG/ML IJ SOLN
25.0000 mg | Freq: Once | INTRAMUSCULAR | Status: AC
  Administered 2022-12-31: 25 mg via INTRAVENOUS
  Filled 2022-12-31: qty 1

## 2022-12-31 MED ORDER — SODIUM CHLORIDE 0.9 % IV BOLUS
1000.0000 mL | Freq: Once | INTRAVENOUS | Status: AC
  Administered 2022-12-31: 1000 mL via INTRAVENOUS

## 2022-12-31 NOTE — ED Triage Notes (Signed)
Pt endorses migraine since yesterday with n/v. Reports bilateral eye pressure. Pt taken ibuprofen at home with no relief.

## 2022-12-31 NOTE — Discharge Instructions (Signed)
As we discussed, your workup in the ER today was reassuring for acute findings.  Laboratory evaluation and CT imaging did not reveal any emergent concerns.  Given that you feel better after medications, there is no indication for additional evaluation at this time.  I recommend that you call your neurologist to schedule an appointment for about follow-up.  I also recommend close primary care follow-up.  Return if development of any new or worsening symptoms.

## 2022-12-31 NOTE — ED Provider Notes (Signed)
[place] EMERGENCY DEPARTMENT AT [facility] Provider Note   CSN: [id number] Arrival date & time: 12/31/22  1157     History  Chief Complaint  Patient presents with   Migraine    Christina Morales is a 22 y.o. female.  Patient with history of juvenile myoclonic epilepsy presents today with complaints of migraine.  She states that same 'hit me like a wave' 2 days ago when she was driving and has been constant since. She states she has never had a headache like this before. She states that the pain is in her frontal region and behind her eyes and does not radiate. Does note that she recently went to the eye doctor and was told she needs glasses but has not received her prescription in the mail. She denies any recent vision changes. She does note that she has been nauseous and vomiting, denies abdominal pain or diarrhea. No fevers, chills, neck pain, weakness, numbness, or tingling. She does note that she has had 2 normal CT scans previously in 2015 and 2019. She does note that she has not had a menstrual cycle since April and has had several negative pregnancy tests.  She went to her primary care doctor for same and was evaluated without any concerning findings.  Denies photophobia or phonophobia.  The history is provided by the patient. No language interpreter was used.  Migraine Associated symptoms include headaches.       Home Medications Prior to Admission medications   Medication Sig Start Date End Date Taking? Authorizing Provider  levETIRAcetam (KEPPRA XR) 750 MG 24 hr tablet Take 1 tablet every night 10/24/22   Van Clines, MD  pantoprazole (PROTONIX) 40 MG tablet Take 1 tablet (40 mg total) by mouth daily. 11/07/22   Jarold Motto, PA  sucralfate (CARAFATE) 1 g tablet Take 1 tablet (1 g total) by mouth 4 (four) times daily -  with meals and at bedtime for 7 days. 11/07/22 11/14/22  Jarold Motto, PA      Allergies    Dairy aid [tilactase]    Review of Systems    Review of Systems  Neurological:  Positive for headaches.  All other systems reviewed and are negative.   Physical Exam Updated Vital Signs BP (!) 123/97 (BP Location: Right Arm)   Pulse 97   Temp 98 F (36.7 C) (Oral)   Resp 18   Ht 5' 3.5" (1.613 m)   Wt 78 kg   LMP 10/31/2022 (Approximate)   SpO2 99%   BMI 29.99 kg/m  Physical Exam Vitals and nursing note reviewed.  Constitutional:      General: She is not in acute distress.    Appearance: Normal appearance. She is normal weight. She is not ill-appearing, toxic-appearing or diaphoretic.  HENT:     Head: Normocephalic and atraumatic.  Eyes:     Extraocular Movements: Extraocular movements intact.     Pupils: Pupils are equal, round, and reactive to light.  Neck:     Comments: No meningismus Cardiovascular:     Rate and Rhythm: Normal rate.  Pulmonary:     Effort: Pulmonary effort is normal. No respiratory distress.  Abdominal:     General: Abdomen is flat.     Palpations: Abdomen is soft.     Tenderness: There is no abdominal tenderness.  Musculoskeletal:        General: Normal range of motion.     Cervical back: Normal range of motion and neck supple.  Skin:  General: Skin is warm and dry.  Neurological:     General: No focal deficit present.     Mental Status: She is alert and oriented to person, place, and time.     GCS: GCS eye subscore is 4. GCS verbal subscore is 5. GCS motor subscore is 6.     Sensory: Sensation is intact.     Motor: Motor function is intact.     Coordination: Coordination is intact.     Gait: Gait is intact.     Comments: Alert and oriented to self, place, time and event.    Speech is fluent, clear without dysarthria or dysphasia.    Strength 5/5 in upper/lower extremities   Sensation intact in upper/lower extremities    CN I not tested  CN II grossly intact visual fields bilaterally. Did not visualize posterior eye.  CN III, IV, VI PERRLA and EOMs intact bilaterally  CN V  Intact sensation to sharp and light touch to the face  CN VII facial movements symmetric  CN VIII not tested  CN IX, X no uvula deviation, symmetric rise of soft palate  CN XI 5/5 SCM and trapezius strength bilaterally  CN XII Midline tongue protrusion, symmetric L/R movements   Psychiatric:        Mood and Affect: Mood normal.        Behavior: Behavior normal.     ED Results / Procedures / Treatments   Labs (all labs ordered are listed, but only abnormal results are displayed) Labs Reviewed  CBC  BASIC METABOLIC PANEL  HCG, QUANTITATIVE, PREGNANCY    EKG None  Radiology No results found.  Procedures Procedures    Medications Ordered in ED Medications  sodium chloride 0.9 % bolus 1,000 mL (has no administration in time range)  prochlorperazine (COMPAZINE) injection 10 mg (has no administration in time range)  diphenhydrAMINE (BENADRYL) injection 25 mg (has no administration in time range)  ketorolac (TORADOL) 15 MG/ML injection 15 mg (has no administration in time range)  dexamethasone (DECADRON) injection 10 mg (has no administration in time range)    ED Course/ Medical Decision Making/ A&P                             Medical Decision Making Amount and/or Complexity of Data Reviewed Labs: ordered. Radiology: ordered.  Risk Prescription drug management.   This patient is a 22 y.o. female who presents to the ED for concern of headache, this involves an extensive number of treatment options, and is a complaint that carries with it a high risk of complications and morbidity. The emergent differential diagnosis prior to evaluation includes, but is not limited to,  Stroke, increased ICP, meningitis, CVA, intracranial tumor, venous sinus thrombosis, migraine, cluster headache, hypertension, drug related, head injury, tension headache, sinusitis, dental abscess, otitis media, TMJ, glaucoma, trigeminal neuralgia.  This is not an exhaustive differential.   Past Medical  History / Co-morbidities / Social History: history of juvenile myoclonic epilepsy  Additional history: Chart reviewed. Pertinent results include: follows with neurology for her epilepsy, last saw them in April 2024. Has had 2 previous normal CT scans in 2015 and 2019  Physical Exam: Physical exam performed. The pertinent findings include: Alert and oriented and neurologically intact without focal deficits  Lab Tests: I ordered, and personally interpreted labs.  The pertinent results include:  no leukocytosis or anemia. No acute laboratory findings   Imaging Studies: I ordered imaging studies  including CT head. I independently visualized and interpreted imaging which showed NAD. I agree with the radiologist interpretation.   Medications: I ordered medication including fluids, compazine, benadryl, decadron, toradol  for pain. Reevaluation of the patient after these medicines showed that the patient resolved. I have reviewed the patients home medicines and have made adjustments as needed.  Disposition: After consideration of the diagnostic results and the patients response to treatment, I feel that emergency department workup does not suggest an emergent condition requiring admission or immediate intervention beyond what has been performed at this time. The plan is: discharge with close outpatient follow-up.  Workup reassuring for acute findings.  Patient without symptoms consistent for thunderclap headache or worrisome for [facility].  Pain completely resolved after medications and patient feels ready to go home. Evaluation and diagnostic testing in the emergency department does not suggest an emergent condition requiring admission or immediate intervention beyond what has been performed at this time.  Plan for discharge with close PCP follow-up.  Patient is understanding and amenable with plan, educated on red flag symptoms that would prompt immediate return.  Patient discharged in stable  condition.  Final Clinical Impression(s) / ED Diagnoses Final diagnoses:  Acute nonintractable headache, unspecified headache type    Rx / DC Orders ED Discharge Orders     None     An After Visit Summary was printed and given to the patient.     Vear Clock 12/31/22 1407    Gerhard Munch, MD 12/31/22 1700

## 2023-02-19 ENCOUNTER — Encounter (INDEPENDENT_AMBULATORY_CARE_PROVIDER_SITE_OTHER): Payer: Self-pay

## 2023-03-11 ENCOUNTER — Ambulatory Visit: Payer: BC Managed Care – PPO | Admitting: Family Medicine

## 2023-03-11 VITALS — BP 115/75 | HR 66 | Temp 98.6°F | Resp 18 | Ht 63.5 in | Wt 176.4 lb

## 2023-03-11 DIAGNOSIS — J0100 Acute maxillary sinusitis, unspecified: Secondary | ICD-10-CM | POA: Diagnosis not present

## 2023-03-11 MED ORDER — AMOXICILLIN-POT CLAVULANATE 875-125 MG PO TABS: 1.0000 | ORAL_TABLET | Freq: Two times a day (BID) | ORAL | 0 refills | Status: DC

## 2023-03-11 NOTE — Patient Instructions (Signed)
It was very nice to see you today!  Sent augmentin to pharmacy.   PLEASE NOTE:  If you had any lab tests please let us know if you have not heard back within a few days. You may see your results on MyChart before we have a chance to review them but we will give you a call once they are reviewed by [place]. If we ordered any referrals today, please let us know if you have not heard from their office within the next week.   Please try these tips to maintain a healthy lifestyle:  Eat most of your calories during the day when you are active. Eliminate processed foods including packaged sweets (pies, cakes, cookies), reduce intake of potatoes, white bread, white pasta, and white rice. Look for whole grain options, oat flour or almond flour.  Each meal should contain half fruits/vegetables, one quarter protein, and one quarter carbs (no bigger than a computer mouse).  Cut down on sweet beverages. This includes juice, soda, and sweet tea. Also watch fruit intake, though this is a healthier sweet option, it still contains natural sugar! Limit to 3 servings daily.  Drink at least 1 glass of water with each meal and aim for at least 8 glasses per day  Exercise at least 150 minutes every week.

## 2023-03-11 NOTE — Progress Notes (Signed)
Subjective:     Patient ID: Christina Morales, female    DOB: 2001/02/18, 22 y.o.   MRN: [id number]  Chief Complaint  Patient presents with   Sore Throat    Sore throat started a couple of weeks ago   Ear Issue    Stuffy ears    Nasal Congestion    Stuffy nose    Chest Pain    Chest tightness started 2 weeks ago    HPI Sore Throat/ Congestion/ CP- Pt complains of a sore throat and chest pain beginning about 2 weeks ago. Endorses accompanied nasal and ear congestion. States her post nasal drip has worsened and feels pressure on her whole face. Her chest tightness is worse on the left side. States that her partner has experienced the same symptoms and tested negative for Covid and Strep. Denies fever, body aches, or n/v/c/d.    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    Past Surgical History:  Procedure Laterality Date   APPENDECTOMY     04/2018   LAPAROSCOPIC APPENDECTOMY N/A 04/21/2018   Procedure: APPENDECTOMY LAPAROSCOPIC;  Surgeon: Manus Rudd, MD;  Location: [facility] OR;  Service: General;  Laterality: N/A;     Current Outpatient Medications:    amoxicillin-clavulanate (AUGMENTIN) 875-125 MG tablet, Take 1 tablet by mouth 2 (two) times daily., Disp: 20 tablet, Rfl: 0   levETIRAcetam (KEPPRA XR) 750 MG 24 hr tablet, Take 1 tablet every night, Disp: 90 tablet, Rfl: 3   levonorgestrel-ethinyl estradiol (AVIANE) 0.1-20 MG-MCG tablet, Take 1 tablet by mouth daily. (Patient not taking: Reported on 03/11/2023), Disp: , Rfl:   No Known Allergies  ROS neg/noncontributory except as noted HPI/below      Objective:     BP 115/75   Pulse 66   Temp 98.6 F (37 C) (Temporal)   Resp 18   Ht 5' 3.5" (1.613 m)   Wt 176 lb 6 oz (80 kg)   LMP 10/31/2022 (Exact Date)   SpO2 99%   BMI 30.75 kg/m  Wt Readings from Last 3 Encounters:   03/11/23 176 lb 6 oz (80 kg)  12/31/22 172 lb (78 kg)  11/07/22 168 lb 6.1 oz (76.4 kg)    Physical Exam   Gen: WDWN NAD HEENT: NCAT, conjunctiva not injected, sclera nonicteric +sinus tender TM WNL B, OP moist, no exudates  NECK:  supple, no thyromegaly, no carotid bruits +small submandibular lymph nodes Heart: RRR.  S1S2+  no murmur LUNGS: CTAB. No wheezes MSK: no gross abnormalities.  NEURO: A&O x3.  CN II-XII intact.  PSYCH: normal mood. Good eye contact    Assessment & Plan:  Acute non-recurrent maxillary sinusitis  Other orders -     Amoxicillin-Pot Clavulanate; Take 1 tablet by mouth 2 (two) times daily.  Dispense: 20 tablet; Refill: 0   Sinusitis-augmentin 875 bid Return if symptoms worsen or fail to improve.  [name],acting as a scribe for Angelena Sole, MD.,have documented all relevant documentation on the behalf of Angelena Sole, MD,as directed by  Angelena Sole, MD while in the presence of Angelena Sole, MD.  I, Angelena Sole, MD, have reviewed all documentation for this visit. The documentation on 03/11/23 for the exam, diagnosis, procedures, and orders are all accurate and complete.   [name]  Ruthine Dose, MD

## 2023-04-08 ENCOUNTER — Encounter: Payer: Self-pay | Admitting: Nurse Practitioner

## 2023-04-08 ENCOUNTER — Ambulatory Visit: Payer: BC Managed Care – PPO | Admitting: Nurse Practitioner

## 2023-04-08 VITALS — BP 100/70 | HR 103 | Temp 98.7°F | Ht 63.5 in | Wt 175.4 lb

## 2023-04-08 DIAGNOSIS — R0981 Nasal congestion: Secondary | ICD-10-CM | POA: Insufficient documentation

## 2023-04-08 DIAGNOSIS — J029 Acute pharyngitis, unspecified: Secondary | ICD-10-CM | POA: Diagnosis not present

## 2023-04-08 LAB — POCT RAPID STREP A (OFFICE): Rapid Strep A Screen: NEGATIVE

## 2023-04-08 LAB — POCT INFLUENZA A/B
Influenza A, POC: NEGATIVE
Influenza B, POC: NEGATIVE

## 2023-04-08 LAB — POC COVID19 BINAXNOW: SARS Coronavirus 2 Ag: NEGATIVE

## 2023-04-08 NOTE — Progress Notes (Signed)
Established Patient Office Visit  Subjective   Patient ID: Christina Morales, female    DOB: 10-13-2000  Age: 22 y.o. MRN: [id number]  Chief Complaint  Patient presents with   Cough    Pt c/o cough and congestion covid test negative this morning.    Patient comes in today for acute visit for the above.   She has past medical history positive for juvenile epilepsy. On keppra for this. Symptoms started yesterday. Mainly experiencing congestion, sore throat, nonproductive cough. No fever, but has seen temperature around 99.5. Took at home covid test and it was negative.     Review of Systems  Constitutional:  Positive for fever.  HENT:  Positive for congestion and sore throat.        (+) PND  Respiratory:  Positive for cough. Negative for shortness of breath and wheezing.   Cardiovascular:  Positive for chest pain.  Neurological:  Positive for headaches.      Objective:     BP 100/70   Pulse (!) 103   Temp 98.7 F (37.1 C)   Ht 5' 3.5" (1.613 m)   Wt 175 lb 6.4 oz (79.6 kg)   LMP 04/02/2023 (Exact Date)   SpO2 97%   BMI 30.58 kg/m    Physical Exam Vitals reviewed.  Constitutional:      General: She is not in acute distress.    Appearance: Normal appearance.  HENT:     Head: Normocephalic and atraumatic.     Mouth/Throat:     Mouth: Mucous membranes are moist.     Tongue: No lesions.     Palate: No mass.     Pharynx: Oropharynx is clear. Uvula midline. No pharyngeal swelling.     Tonsils: No tonsillar exudate.  Neck:     Vascular: No carotid bruit.  Cardiovascular:     Rate and Rhythm: Normal rate and regular rhythm.     Pulses: Normal pulses.     Heart sounds: Normal heart sounds.  Pulmonary:     Effort: Pulmonary effort is normal.     Breath sounds: Normal breath sounds.  Skin:    General: Skin is warm and dry.  Neurological:     General: No focal deficit present.     Mental Status: She is alert and oriented to person, place, and time.  Psychiatric:         Mood and Affect: Mood normal.        Behavior: Behavior normal.        Judgment: Judgment normal.      Results for orders placed or performed in visit on 04/08/23  POCT Influenza A/B  Result Value Ref Range   Influenza A, POC Negative Negative   Influenza B, POC Negative Negative  POC COVID-19  Result Value Ref Range   SARS Coronavirus 2 Ag Negative Negative  POCT rapid strep A  Result Value Ref Range   Rapid Strep A Screen Negative Negative      The ASCVD Risk score (Arnett DK, et al., 2019) failed to calculate for the following reasons:   The 2019 ASCVD risk score is only valid for ages 105 to 26    Assessment & Plan:   Problem List Items Addressed This Visit       Respiratory   Pharyngitis    Acute, likely viral POC strep test negative. Exam not concerning for active strep infection.  Treat symptoms with rest, hydration, and as needed tylenol. Call if symptoms persist >10  days Work note provided for patient per request.       Relevant Orders   POCT rapid strep A (Completed)     Other   Nasal congestion - Primary    Acute, likely viral POC strep test negative, POC covid negative, POC flu negative. Exam not concerning for active strep infection.  Treat symptoms with rest, hydration, and as needed tylenol. Call if symptoms persist >10 days Work note provided for patient per request.       Relevant Orders   POCT Influenza A/B (Completed)   POC COVID-19 (Completed)    Return if symptoms worsen or fail to improve.    Elenore Paddy, NP

## 2023-04-08 NOTE — Assessment & Plan Note (Signed)
Acute, likely viral POC strep test negative, POC covid negative, POC flu negative. Exam not concerning for active strep infection.  Treat symptoms with rest, hydration, and as needed tylenol. Call if symptoms persist >10 days Work note provided for patient per request.

## 2023-04-08 NOTE — Assessment & Plan Note (Signed)
Acute, likely viral POC strep test negative. Exam not concerning for active strep infection.  Treat symptoms with rest, hydration, and as needed tylenol. Call if symptoms persist >10 days Work note provided for patient per request.

## 2023-04-24 ENCOUNTER — Telehealth: Payer: BC Managed Care – PPO | Admitting: Neurology

## 2023-06-12 ENCOUNTER — Encounter: Payer: Self-pay | Admitting: Family Medicine

## 2023-06-12 ENCOUNTER — Encounter: Payer: Self-pay | Admitting: Family

## 2023-06-12 ENCOUNTER — Ambulatory Visit: Payer: BC Managed Care – PPO | Admitting: Family

## 2023-06-12 VITALS — BP 121/79 | HR 92 | Temp 97.7°F | Ht 63.5 in | Wt 180.5 lb

## 2023-06-12 DIAGNOSIS — J029 Acute pharyngitis, unspecified: Secondary | ICD-10-CM

## 2023-06-12 LAB — POC COVID19 BINAXNOW: SARS Coronavirus 2 Ag: NEGATIVE

## 2023-06-12 LAB — POCT RAPID STREP A (OFFICE): Rapid Strep A Screen: NEGATIVE

## 2023-06-12 NOTE — Progress Notes (Signed)
Patient ID: Christina Morales, female    DOB: [birth year], 22 y.o.   MRN: [id number]  Chief Complaint  Patient presents with   Sinus Problem    Pt c/o facial pressure, sore throat, dry cough and headache, Present for 5 days. Has tried ibuprofen and tylenol. Pt has not had a flu shot.    Discussed the use of AI scribe software for clinical note transcription with the patient, who gave verbal consent to proceed.  History of Present Illness   The patient, with no known allergies, presents with a five-day history of sinus congestion and a dry cough. She describes a sensation of postnasal drip and a tickling in her throat that triggers the cough. She has been managing her symptoms with ibuprofen, Tylenol, hot showers, and over-the-counter Mucinex. She also reports occasional stuffiness in her ears and pressure in her forehead and T-zone area. She has noticed yellow mucus when she blows her nose or coughs. She has not experienced any tooth pain. She has previously used saline rinses for similar symptoms but found them uncomfortable.      Assessment & Plan:     Upper Respiratory Infection - Symptoms of sinus congestion, dry cough, and postnasal drip for 5 days. No fever. Negative rapid testing. Likely viral etiology. -Continue ibuprofen 600mg  up to 3 times a day for pressure and body aches. -Consider OTC pseudoephedrine w/antihistamine for postnasal drip & decongestion. -Use saline nasal spray several times a day for moisturization and disinfection. -Sleep propped up to aid in drainage and reduce cough. -Consider use of a humidifier for soothing overnight relief. -If symptoms persist or worsen past next week, contact the office for further evaluation.     Subjective:    Outpatient Medications Prior to Visit  Medication Sig Dispense Refill   levETIRAcetam (KEPPRA XR) 750 MG 24 hr tablet Take 1 tablet every night 90 tablet 3   No facility-administered medications prior to visit.   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  Constitutional:      Appearance: Normal appearance. She is ill-appearing.     Interventions: Face mask in place.  HENT:     Right Ear: Tympanic membrane and ear canal normal.     Left Ear: Tympanic membrane and ear canal normal.     Nose:     Right Sinus: Frontal sinus tenderness present.     Left Sinus: Frontal sinus tenderness present.     Mouth/Throat:     Mouth: Mucous membranes are moist.     Pharynx: Posterior oropharyngeal erythema present. No pharyngeal swelling, oropharyngeal exudate or uvula swelling.     Tonsils: No tonsillar exudate or tonsillar abscesses.  Cardiovascular:     Rate and Rhythm: Normal rate and regular rhythm.  Pulmonary:     Effort: Pulmonary effort is normal.     Breath sounds: Normal breath sounds.  Musculoskeletal:        General: Normal range of motion.  Lymphadenopathy:     Head:     Right side of head: No preauricular or  posterior auricular adenopathy.     Left side of head: No preauricular or posterior auricular adenopathy.     Cervical: No cervical adenopathy.  Skin:    General: Skin is warm and dry.  Neurological:     Mental Status: She is alert.  Psychiatric:        Mood and Affect: Mood normal.        Behavior: Behavior normal.    BP 121/79 (BP Location: Left Arm, Patient Position: Sitting, Cuff Size: Large)   Pulse 92   Temp 97.7 F (36.5 C) (Temporal)   Ht 5' 3.5" (1.613 m)   Wt 180 lb 8 oz (81.9 kg)   SpO2 96%   BMI 31.47 kg/m  Wt Readings from Last 3 Encounters:  06/12/23 180 lb 8 oz (81.9  kg)  04/08/23 175 lb 6.4 oz (79.6 kg)  03/11/23 176 lb 6 oz (80 kg)      Dulce Sellar, NP

## 2023-09-22 ENCOUNTER — Telehealth: Payer: Self-pay | Admitting: Neurology

## 2023-09-22 DIAGNOSIS — G40B09 Juvenile myoclonic epilepsy, not intractable, without status epilepticus: Secondary | ICD-10-CM

## 2023-09-22 NOTE — Telephone Encounter (Signed)
 Pt needs new referral sent to [facility] fax [phone]

## 2023-09-22 NOTE — Telephone Encounter (Signed)
 Caller stated she has moved to a new city and would like to know if Dr Karel Jarvis can get her referred to a new neurologist in her area.

## 2023-09-28 ENCOUNTER — Encounter: Payer: Self-pay | Admitting: Neurology

## 2023-09-30 ENCOUNTER — Other Ambulatory Visit: Payer: Self-pay | Admitting: Neurology

## 2024-07-05 ENCOUNTER — Other Ambulatory Visit: Payer: Self-pay | Admitting: Neurology
